# Patient Record
Sex: Male | Born: 1975 | Race: White | Hispanic: No | Marital: Married | Smoking: Never smoker
Health system: Southern US, Community
[De-identification: ages and names within clinical notes are randomized; demographics above are authoritative.]

## PROBLEM LIST (undated history)

## (undated) DIAGNOSIS — Z9889 Other specified postprocedural states: Secondary | ICD-10-CM

## (undated) DIAGNOSIS — R112 Nausea with vomiting, unspecified: Secondary | ICD-10-CM

## (undated) DIAGNOSIS — N200 Calculus of kidney: Secondary | ICD-10-CM

## (undated) DIAGNOSIS — J40 Bronchitis, not specified as acute or chronic: Secondary | ICD-10-CM

## (undated) DIAGNOSIS — I10 Essential (primary) hypertension: Secondary | ICD-10-CM

## (undated) DIAGNOSIS — G4730 Sleep apnea, unspecified: Secondary | ICD-10-CM

## (undated) HISTORY — PX: URETEROSCOPY: SHX842

---

## 1998-02-04 ENCOUNTER — Encounter: Payer: Self-pay | Admitting: Urology

## 1998-02-04 ENCOUNTER — Ambulatory Visit (HOSPITAL_COMMUNITY): Admission: RE | Admit: 1998-02-04 | Discharge: 1998-02-04 | Payer: Self-pay | Admitting: Urology

## 1998-12-21 ENCOUNTER — Encounter: Payer: Self-pay | Admitting: Urology

## 1998-12-21 ENCOUNTER — Ambulatory Visit (HOSPITAL_COMMUNITY): Admission: RE | Admit: 1998-12-21 | Discharge: 1998-12-21 | Payer: Self-pay | Admitting: Urology

## 2000-12-07 ENCOUNTER — Encounter: Payer: Self-pay | Admitting: Emergency Medicine

## 2000-12-07 ENCOUNTER — Emergency Department (HOSPITAL_COMMUNITY): Admission: EM | Admit: 2000-12-07 | Discharge: 2000-12-07 | Payer: Self-pay | Admitting: *Deleted

## 2000-12-20 ENCOUNTER — Encounter: Admission: RE | Admit: 2000-12-20 | Discharge: 2000-12-20 | Payer: Self-pay | Admitting: Urology

## 2000-12-20 ENCOUNTER — Encounter: Payer: Self-pay | Admitting: Urology

## 2001-04-04 ENCOUNTER — Encounter: Admission: RE | Admit: 2001-04-04 | Discharge: 2001-04-04 | Payer: Self-pay | Admitting: Urology

## 2001-04-04 ENCOUNTER — Encounter: Payer: Self-pay | Admitting: Urology

## 2001-05-09 ENCOUNTER — Encounter: Payer: Self-pay | Admitting: Urology

## 2001-05-09 ENCOUNTER — Encounter: Admission: RE | Admit: 2001-05-09 | Discharge: 2001-05-09 | Payer: Self-pay | Admitting: Urology

## 2001-09-07 ENCOUNTER — Encounter: Admission: RE | Admit: 2001-09-07 | Discharge: 2001-09-07 | Payer: Self-pay | Admitting: Urology

## 2001-09-07 ENCOUNTER — Encounter: Payer: Self-pay | Admitting: Urology

## 2002-05-02 ENCOUNTER — Ambulatory Visit (HOSPITAL_BASED_OUTPATIENT_CLINIC_OR_DEPARTMENT_OTHER): Admission: RE | Admit: 2002-05-02 | Discharge: 2002-05-02 | Payer: Self-pay | Admitting: Urology

## 2002-05-02 ENCOUNTER — Encounter: Payer: Self-pay | Admitting: Urology

## 2002-05-03 ENCOUNTER — Emergency Department (HOSPITAL_COMMUNITY): Admission: EM | Admit: 2002-05-03 | Discharge: 2002-05-03 | Payer: Self-pay | Admitting: Emergency Medicine

## 2002-05-03 ENCOUNTER — Encounter: Payer: Self-pay | Admitting: Emergency Medicine

## 2002-05-10 ENCOUNTER — Encounter: Payer: Self-pay | Admitting: Urology

## 2002-05-10 ENCOUNTER — Ambulatory Visit (HOSPITAL_COMMUNITY): Admission: RE | Admit: 2002-05-10 | Discharge: 2002-05-10 | Payer: Self-pay | Admitting: Urology

## 2003-12-11 ENCOUNTER — Ambulatory Visit (HOSPITAL_COMMUNITY): Admission: RE | Admit: 2003-12-11 | Discharge: 2003-12-11 | Payer: Self-pay | Admitting: Urology

## 2006-08-24 ENCOUNTER — Ambulatory Visit (HOSPITAL_COMMUNITY): Admission: RE | Admit: 2006-08-24 | Discharge: 2006-08-24 | Payer: Self-pay | Admitting: Urology

## 2006-10-02 ENCOUNTER — Ambulatory Visit (HOSPITAL_COMMUNITY): Admission: RE | Admit: 2006-10-02 | Discharge: 2006-10-02 | Payer: Self-pay | Admitting: Urology

## 2007-02-20 ENCOUNTER — Encounter: Admission: RE | Admit: 2007-02-20 | Discharge: 2007-02-20 | Payer: Self-pay | Admitting: Sports Medicine

## 2007-03-19 ENCOUNTER — Encounter: Admission: RE | Admit: 2007-03-19 | Discharge: 2007-03-19 | Payer: Self-pay | Admitting: Sports Medicine

## 2007-04-04 ENCOUNTER — Encounter: Admission: RE | Admit: 2007-04-04 | Discharge: 2007-04-04 | Payer: Self-pay | Admitting: Sports Medicine

## 2009-06-23 ENCOUNTER — Ambulatory Visit (HOSPITAL_BASED_OUTPATIENT_CLINIC_OR_DEPARTMENT_OTHER): Admission: RE | Admit: 2009-06-23 | Discharge: 2009-06-23 | Payer: Self-pay | Admitting: Urology

## 2010-04-18 ENCOUNTER — Encounter: Payer: Self-pay | Admitting: Sports Medicine

## 2010-06-20 LAB — POCT HEMOGLOBIN-HEMACUE: Hemoglobin: 16.2 g/dL (ref 13.0–17.0)

## 2011-09-27 ENCOUNTER — Other Ambulatory Visit: Payer: Self-pay | Admitting: Urology

## 2011-10-17 ENCOUNTER — Encounter (HOSPITAL_COMMUNITY): Admission: RE | Payer: Self-pay | Source: Ambulatory Visit

## 2011-10-17 ENCOUNTER — Ambulatory Visit (HOSPITAL_COMMUNITY): Admission: RE | Admit: 2011-10-17 | Payer: Self-pay | Source: Ambulatory Visit | Admitting: Urology

## 2011-10-17 SURGERY — LITHOTRIPSY, ESWL
Anesthesia: LOCAL | Laterality: Right

## 2012-01-06 ENCOUNTER — Emergency Department (HOSPITAL_COMMUNITY)
Admission: EM | Admit: 2012-01-06 | Discharge: 2012-01-06 | Disposition: A | Discharge status: Home / Self Care | Payer: BC Managed Care – PPO | Attending: Emergency Medicine | Admitting: Emergency Medicine

## 2012-01-06 ENCOUNTER — Encounter (HOSPITAL_COMMUNITY): Payer: Self-pay | Admitting: Emergency Medicine

## 2012-01-06 DIAGNOSIS — I10 Essential (primary) hypertension: Secondary | ICD-10-CM | POA: Insufficient documentation

## 2012-01-06 DIAGNOSIS — T63461A Toxic effect of venom of wasps, accidental (unintentional), initial encounter: Secondary | ICD-10-CM | POA: Insufficient documentation

## 2012-01-06 DIAGNOSIS — T7840XA Allergy, unspecified, initial encounter: Secondary | ICD-10-CM

## 2012-01-06 DIAGNOSIS — T6391XA Toxic effect of contact with unspecified venomous animal, accidental (unintentional), initial encounter: Secondary | ICD-10-CM | POA: Insufficient documentation

## 2012-01-06 HISTORY — DX: Calculus of kidney: N20.0

## 2012-01-06 HISTORY — DX: Essential (primary) hypertension: I10

## 2012-01-06 MED ORDER — METHYLPREDNISOLONE SODIUM SUCC 125 MG IJ SOLR
125.0000 mg | Freq: Once | INTRAMUSCULAR | Status: AC
  Administered 2012-01-06: 125 mg via INTRAVENOUS
  Filled 2012-01-06: qty 2

## 2012-01-06 MED ORDER — OXYCODONE-ACETAMINOPHEN 5-325 MG PO TABS
1.0000 | ORAL_TABLET | Freq: Once | ORAL | Status: AC
  Administered 2012-01-06: 1 via ORAL
  Filled 2012-01-06: qty 1

## 2012-01-06 MED ORDER — PREDNISONE 10 MG PO TABS: 60.0000 mg | ORAL_TABLET | Freq: Every day | ORAL | Status: DC

## 2012-01-06 MED ORDER — EPINEPHRINE HCL 1 MG/ML IJ SOLN
INTRAMUSCULAR | Status: AC
  Administered 2012-01-06: 13:00:00
  Filled 2012-01-06: qty 1

## 2012-01-06 MED ORDER — EPINEPHRINE 0.3 MG/0.3ML IJ DEVI: 0.3000 mg | INTRAMUSCULAR | Status: AC | PRN

## 2012-01-06 MED ORDER — DIPHENHYDRAMINE HCL 50 MG/ML IJ SOLN
50.0000 mg | Freq: Once | INTRAMUSCULAR | Status: AC
  Administered 2012-01-06: 50 mg via INTRAVENOUS
  Filled 2012-01-06: qty 1

## 2012-01-06 NOTE — ED Notes (Signed)
Per EMS, pt stung by yellow jacket in R hand. Pt admin Epi pen immediately. Pt stated EpiPen had already expired. EMS admininstered 0.3 Epi IM. Pt reports hx of worsening hives w/ Benedryl. EMS admin 50 mg Zantac. No SHOB, wheezes, resp. Difficulties per EMS. Mild hives on sides and feet bilaterally w/ decreased itching.

## 2012-01-06 NOTE — ED Notes (Signed)
WGN:[id number]<HY> Expected date:01/06/12<BR> Expected time:<BR> Means of arrival:<BR> Comments:<BR> [facility] bee sting to hand, used epi pen x 1, zantac-- allergic to benadryl

## 2012-01-06 NOTE — ED Provider Notes (Signed)
History     CSN: [id number]  Arrival date & time 01/06/12  1238   First MD Initiated Contact with Patient 01/06/12 1246      Chief Complaint  Patient presents with  . Allergic Reaction     The history is provided by the patient.   the patient reports yellow jacket bite to his right hand with immediate development of redness surrounding the bite as well as development of systemic urticaria.  He gave himself an epinephrine injection aklthough the epi pen was expired.  EMS called immediately.  He states he feels much better at this time.  His urticaria is almost resolved.  No difficulty breathing or swallowing at this time.  No facial swelling.  No tongue itching or swelling to  Past Medical History  Diagnosis Date  . Hypertension   . Kidney stones     History reviewed. No pertinent past surgical history.  No family history on file.  History  Substance Use Topics  . Smoking status: Not on file  . Smokeless tobacco: Not on file  . Alcohol Use:       Review of Systems  All other systems reviewed and are negative.    Allergies  Benadryl  Home Medications   Current Outpatient Rx  Name Route Sig Dispense Refill  . LISINOPRIL-HYDROCHLOROTHIAZIDE 10-12.5 MG PO TABS Oral Take 1 tablet by mouth daily.    Marland Kitchen TAMSULOSIN HCL 0.4 MG PO CAPS Oral Take 0.4 mg by mouth daily.      BP 149/100  Pulse 83  Temp 98 F (36.7 C) (Oral)  Resp 20  SpO2 98%  Physical Exam  Nursing note and vitals reviewed. Constitutional: He is oriented to person, place, and time. He appears well-developed and well-nourished.  HENT:  Head: Normocephalic and atraumatic.       Posterior pharynx normal.  Tolerating secretions.  Airway patent.  Eyes: EOM are normal.  Neck: Normal range of motion.  Cardiovascular: Normal rate, regular rhythm, normal heart sounds and intact distal pulses.   Pulmonary/Chest: Effort normal and breath sounds normal. No respiratory distress.  Abdominal: Soft. He  exhibits no distension. There is no tenderness.       Small hives remain on the abdominal wall  Musculoskeletal: Normal range of motion.  Neurological: He is alert and oriented to person, place, and time.  Skin: Skin is warm and dry.  Psychiatric: He has a normal mood and affect. Judgment normal.    ED Course  Procedures (including critical care time)  Labs Reviewed - No data to display No results found.   1. Allergic reaction       MDM  Likely severe urticaria.  No shortness of breath or difficulty breathing or sensation of throat closing. Epi given in route.  Symptoms improved at this time.  The patient be given Benadryl and Solu-Medrol.  There is this question of Benadryl allergy however I suspect he was having worsening hives the last time he took Benadryl and that Benadryl itself did not cause hives.  We'll monitor in the emergency Department total of 3 hours status post epinephrine.  This will be observation until 2:30 PM  2:55 PM Feels much better at this time. Tolerated benadryl         Lyanne Co, MD 01/06/12 1455

## 2012-09-27 ENCOUNTER — Ambulatory Visit (INDEPENDENT_AMBULATORY_CARE_PROVIDER_SITE_OTHER): Payer: BC Managed Care – PPO | Admitting: Sports Medicine

## 2012-09-27 ENCOUNTER — Encounter: Payer: Self-pay | Admitting: Sports Medicine

## 2012-09-27 VITALS — BP 146/94 | HR 87 | Ht 69.0 in | Wt 200.0 lb

## 2012-09-27 DIAGNOSIS — S161XXA Strain of muscle, fascia and tendon at neck level, initial encounter: Secondary | ICD-10-CM

## 2012-09-27 DIAGNOSIS — S139XXA Sprain of joints and ligaments of unspecified parts of neck, initial encounter: Secondary | ICD-10-CM

## 2012-09-27 MED ORDER — PREDNISONE 10 MG PO KIT: PACK | ORAL | Status: DC

## 2012-09-27 MED ORDER — HYDROCODONE-ACETAMINOPHEN 5-325 MG PO TABS: 1.0000 | ORAL_TABLET | Freq: Two times a day (BID) | ORAL | Status: DC | PRN

## 2012-09-27 NOTE — Progress Notes (Signed)
  Subjective:    Patient ID: Kevin Herman, male    DOB: 1975/04/30, 37 y.o.   MRN: [id number]  HPI chief complaint: Neck and bilateral shoulder pain  Patient is a very pleasant 37 year old male who comes in today complaining of one week of neck and bilateral shoulder pain. He has been a patient of mine at [facility] for years. 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 He will occasionally get a flareup which responds well to oral prednisone and hydrocodone. His last flareup was several months ago and he was actually doing very well until he fell onto his right elbow while trying to catch a ball last week. This "jammed" his neck and he's had returning symptoms since. Describes a diffuse discomfort across his neck and into the posterior shoulders. Similar in nature to the symptoms he's experienced previously. No radiating pain into his arms. No associated numbness or tingling. No deep-seated shoulder pain. Pain does awaken him at night.  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 No acute distress. Awake alert and oriented x3. Vital signs are reviewed  Cervical spine: There is limited cervical rotation both to the left and right by about 20 but it is nonpainful. There is full flexion and extension but reproducible pain with extension. No tenderness to palpation along cervical midline but there is diffuse tenderness along the paraspinal musculature as well as into the parascapular region. No spasm. Negative Spurling's bilaterally. Strength is 5/5 both upper  extremities. Biceps reflex on the right is 1/4 compared to 2/4 on the left. Brachial radialis and triceps reflexes are 2/4 and equal bilaterally. No atrophy. Sensation is intact to light touch grossly. Good radial and ulnar pulses.       Assessment & Plan:  1. Returning neck pain secondary to cervical strain  6 day Sterapred Dosepak to take as directed. Vicodin 5/325 to take twice a day when necessary #30 with no refills. If symptoms persist return to the office for reevaluation and further workup.

## 2013-01-26 DIAGNOSIS — J40 Bronchitis, not specified as acute or chronic: Secondary | ICD-10-CM

## 2013-01-26 HISTORY — DX: Bronchitis, not specified as acute or chronic: J40

## 2013-03-11 ENCOUNTER — Encounter: Payer: Self-pay | Admitting: Sports Medicine

## 2013-03-11 ENCOUNTER — Ambulatory Visit (INDEPENDENT_AMBULATORY_CARE_PROVIDER_SITE_OTHER): Payer: BC Managed Care – PPO | Admitting: Sports Medicine

## 2013-03-11 VITALS — BP 164/116 | HR 78 | Ht 69.0 in | Wt 200.0 lb

## 2013-03-11 DIAGNOSIS — Z5189 Encounter for other specified aftercare: Secondary | ICD-10-CM

## 2013-03-11 DIAGNOSIS — S335XXA Sprain of ligaments of lumbar spine, initial encounter: Secondary | ICD-10-CM

## 2013-03-11 DIAGNOSIS — S39012A Strain of muscle, fascia and tendon of lower back, initial encounter: Secondary | ICD-10-CM

## 2013-03-11 DIAGNOSIS — S161XXD Strain of muscle, fascia and tendon at neck level, subsequent encounter: Secondary | ICD-10-CM

## 2013-03-11 MED ORDER — HYDROCODONE-ACETAMINOPHEN 5-325 MG PO TABS: 1.0000 | ORAL_TABLET | Freq: Two times a day (BID) | ORAL | Status: DC | PRN

## 2013-03-11 MED ORDER — METHYLPREDNISOLONE ACETATE 80 MG/ML IJ SUSP
80.0000 mg | Freq: Once | INTRAMUSCULAR | Status: AC
  Administered 2013-03-11: 80 mg via INTRAMUSCULAR

## 2013-03-11 MED ORDER — KETOROLAC TROMETHAMINE 60 MG/2ML IM SOLN
60.0000 mg | Freq: Once | INTRAMUSCULAR | Status: AC
  Administered 2013-03-11: 60 mg via INTRAMUSCULAR

## 2013-03-12 NOTE — Progress Notes (Signed)
   Subjective:    Patient ID: Kevin Herman, male    DOB: 12-Apr-1975, 37 y.o.   MRN: [id number]  HPI chief complaint: Neck and low back pain  Taygen comes in today with returning neck pain. He has a well-documented history of recurring neck strain which has responded well in the past to oral prednisone and when necessary hydrocodone. Last seen in the office back in July. Symptoms resolved up until a couple weeks ago. He was wrestling around with a friend and feels like that is what has reaggravated his condition. No numbness and tingling into his arms. Symptoms are identical to what he's experienced previously. He is also complaining of acute mid to low back pain which occurred earlier today. 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 No radiating pain into his legs. No numbness or tingling in his legs. Interim medical history is unchanged    Review of Systems     Objective:   Physical Exam Well-developed, well-nourished. No acute distress. Vital signs are reviewed. Cervical spine: Limited cervical rotation to the right by about 50%. He has full cervical rotation to the left. Full flexion and extension. No tenderness along the midline but there is tenderness to palpation along the right paraspinal musculature and into the right trapezius. No palpable trigger point. No focal neurological deficits of either upper extremity. Lumbar spine: Mild tenderness to palpation along the paraspinal musculature. No midline tenderness. Negative straight leg raise bilaterally. No focal neurological deficit of either lower extremity .      Assessment & Plan:  1. Cervical strain 2. Acute mid to low back pain secondary to lumbar strain  80 mg of Depo-Medrol IM. 60 mg of Toradol IM. 6 day Sterapred Dosepak to take as directed starting tomorrow. Hydrocodone refill to take for more severe pain. Increase activity as tolerated. Followup for ongoing or  recalcitrant issues.  Of note, his blood pressure was noted to be markedly elevated at 164/116. He is instructed to followup with his primary care physician for further evaluation and treatment of this.

## 2013-08-06 ENCOUNTER — Other Ambulatory Visit: Payer: Self-pay | Admitting: Urology

## 2013-08-09 ENCOUNTER — Emergency Department (HOSPITAL_COMMUNITY): Payer: BC Managed Care – PPO

## 2013-08-09 ENCOUNTER — Encounter (HOSPITAL_COMMUNITY): Payer: Self-pay | Admitting: *Deleted

## 2013-08-09 ENCOUNTER — Encounter (HOSPITAL_COMMUNITY): Payer: Self-pay | Admitting: Emergency Medicine

## 2013-08-09 ENCOUNTER — Emergency Department (HOSPITAL_COMMUNITY)
Admission: EM | Admit: 2013-08-09 | Discharge: 2013-08-09 | Disposition: A | Discharge status: Home / Self Care | Payer: BC Managed Care – PPO | Attending: Emergency Medicine | Admitting: Emergency Medicine

## 2013-08-09 DIAGNOSIS — R110 Nausea: Secondary | ICD-10-CM | POA: Insufficient documentation

## 2013-08-09 DIAGNOSIS — Z87442 Personal history of urinary calculi: Secondary | ICD-10-CM | POA: Insufficient documentation

## 2013-08-09 DIAGNOSIS — Z8709 Personal history of other diseases of the respiratory system: Secondary | ICD-10-CM | POA: Insufficient documentation

## 2013-08-09 DIAGNOSIS — N201 Calculus of ureter: Secondary | ICD-10-CM | POA: Insufficient documentation

## 2013-08-09 DIAGNOSIS — N289 Disorder of kidney and ureter, unspecified: Secondary | ICD-10-CM

## 2013-08-09 DIAGNOSIS — I10 Essential (primary) hypertension: Secondary | ICD-10-CM | POA: Insufficient documentation

## 2013-08-09 LAB — I-STAT CHEM 8, ED
BUN: 29 mg/dL — ABNORMAL HIGH (ref 6–23)
CHLORIDE: 104 meq/L (ref 96–112)
Calcium, Ion: 1.2 mmol/L (ref 1.12–1.23)
Creatinine, Ser: 1.6 mg/dL — ABNORMAL HIGH (ref 0.50–1.35)
Glucose, Bld: 113 mg/dL — ABNORMAL HIGH (ref 70–99)
HEMATOCRIT: 53 % — AB (ref 39.0–52.0)
Hemoglobin: 18 g/dL — ABNORMAL HIGH (ref 13.0–17.0)
POTASSIUM: 4.4 meq/L (ref 3.7–5.3)
SODIUM: 140 meq/L (ref 137–147)
TCO2: 25 mmol/L (ref 0–100)

## 2013-08-09 LAB — URINALYSIS, ROUTINE W REFLEX MICROSCOPIC
Bilirubin Urine: NEGATIVE
Glucose, UA: NEGATIVE mg/dL
Ketones, ur: 15 mg/dL — AB
LEUKOCYTES UA: NEGATIVE
NITRITE: NEGATIVE
PH: 5.5 (ref 5.0–8.0)
Protein, ur: 30 mg/dL — AB
SPECIFIC GRAVITY, URINE: 1.026 (ref 1.005–1.030)
UROBILINOGEN UA: 0.2 mg/dL (ref 0.0–1.0)

## 2013-08-09 LAB — CBC WITH DIFFERENTIAL/PLATELET
Basophils Absolute: 0 10*3/uL (ref 0.0–0.1)
Basophils Relative: 0 % (ref 0–1)
EOS ABS: 0.1 10*3/uL (ref 0.0–0.7)
EOS PCT: 0 % (ref 0–5)
HCT: 48.4 % (ref 39.0–52.0)
HEMOGLOBIN: 16.7 g/dL (ref 13.0–17.0)
LYMPHS ABS: 1.5 10*3/uL (ref 0.7–4.0)
LYMPHS PCT: 13 % (ref 12–46)
MCH: 31.9 pg (ref 26.0–34.0)
MCHC: 34.5 g/dL (ref 30.0–36.0)
MCV: 92.5 fL (ref 78.0–100.0)
MONOS PCT: 7 % (ref 3–12)
Monocytes Absolute: 0.9 10*3/uL (ref 0.1–1.0)
Neutro Abs: 9.5 10*3/uL — ABNORMAL HIGH (ref 1.7–7.7)
Neutrophils Relative %: 80 % — ABNORMAL HIGH (ref 43–77)
PLATELETS: 211 10*3/uL (ref 150–400)
RBC: 5.23 MIL/uL (ref 4.22–5.81)
RDW: 12.4 % (ref 11.5–15.5)
WBC: 11.9 10*3/uL — AB (ref 4.0–10.5)

## 2013-08-09 LAB — URINE MICROSCOPIC-ADD ON

## 2013-08-09 MED ORDER — ONDANSETRON 8 MG PO TBDP: 8.0000 mg | ORAL_TABLET | Freq: Three times a day (TID) | ORAL | Status: DC | PRN

## 2013-08-09 MED ORDER — SODIUM CHLORIDE 0.9 % IV BOLUS (SEPSIS)
500.0000 mL | Freq: Once | INTRAVENOUS | Status: AC
  Administered 2013-08-09: 500 mL via INTRAVENOUS

## 2013-08-09 MED ORDER — HYDROMORPHONE HCL PF 1 MG/ML IJ SOLN
0.5000 mg | Freq: Once | INTRAMUSCULAR | Status: AC
  Administered 2013-08-09: 0.5 mg via INTRAVENOUS
  Filled 2013-08-09: qty 1

## 2013-08-09 MED ORDER — OXYCODONE-ACETAMINOPHEN 5-325 MG PO TABS: 1.0000 | ORAL_TABLET | Freq: Four times a day (QID) | ORAL | Status: DC | PRN

## 2013-08-09 MED ORDER — ONDANSETRON HCL 4 MG/2ML IJ SOLN
4.0000 mg | Freq: Once | INTRAMUSCULAR | Status: AC
  Administered 2013-08-09: 4 mg via INTRAVENOUS
  Filled 2013-08-09: qty 2

## 2013-08-09 NOTE — ED Provider Notes (Signed)
CSN: [id number]     Arrival date & time 08/09/13  1829 History   First MD Initiated Contact with Patient 08/09/13 1837     Chief Complaint  Patient presents with  . Flank Pain     (Consider location/radiation/quality/duration/timing/severity/associated sxs/prior Treatment) Patient is a 38 y.o. male presenting with flank pain. The history is provided by the patient.  Flank Pain This is a recurrent problem. Pertinent negatives include no chest pain, no abdominal pain, no headaches and no shortness of breath.   patient has 5 x 8 mm right ureteral stone. The pain began around a week ago he was seen in urology and had a KUB already. He is scheduled for lithotripsy in 3 days. No fevers. He states the pain got worse today. He's had nausea and has been eating less today. No fevers. States the pain is increased.  Past Medical History  Diagnosis Date  . Hypertension   . Kidney stones   . Bronchitis 01/2013   Past Surgical History  Procedure Laterality Date  . Ureteroscopy  1999,2000,2004,2005,07/2006,09/2006,2011  . Lithotripsy Right 08/12/2013   History reviewed. No pertinent family history. History  Substance Use Topics  . Smoking status: Never Smoker   . Smokeless tobacco: Never Used  . Alcohol Use: No    Review of Systems  Constitutional: Negative for fever, activity change and appetite change.  Eyes: Negative for pain.  Respiratory: Negative for chest tightness and shortness of breath.   Cardiovascular: Negative for chest pain and leg swelling.  Gastrointestinal: Positive for nausea. Negative for vomiting, abdominal pain and diarrhea.  Genitourinary: Positive for flank pain. Negative for penile swelling, scrotal swelling and testicular pain.  Musculoskeletal: Negative for back pain and neck stiffness.  Skin: Negative for rash.  Neurological: Negative for weakness, numbness and headaches.  Psychiatric/Behavioral: Negative for behavioral problems.      Allergies  Bee  venom  Home Medications   Prior to Admission medications   Medication Sig Start Date End Date Taking? Authorizing Provider  EPINEPHrine (EPIPEN) 0.3 mg/0.3 mL DEVI Inject 0.3 mLs (0.3 mg total) into the muscle as needed. 01/06/12  Yes Lyanne CoKevin M Campos, MD  HYDROcodone-acetaminophen (NORCO/VICODIN) 5-325 MG per tablet Take 1 tablet by mouth 2 (two) times daily as needed. 03/11/13  Yes Ralene Corkimothy R Draper, DO  silodosin (RAPAFLO) 8 MG CAPS capsule Take 8 mg by mouth at bedtime as needed (urine flow).    Yes Historical Provider, MD   BP 148/96  Pulse 63  Temp(Src) 97.6 F (36.4 C) (Oral)  Resp 18  Wt 204 lb (92.534 kg)  SpO2 98% Physical Exam  Nursing note and vitals reviewed. Constitutional: He is oriented to person, place, and time. He appears well-developed and well-nourished.  HENT:  Head: Normocephalic and atraumatic.  Eyes: EOM are normal. Pupils are equal, round, and reactive to light.  Neck: Normal range of motion. Neck supple.  Cardiovascular: Normal rate, regular rhythm and normal heart sounds.   No murmur heard. Pulmonary/Chest: Effort normal and breath sounds normal.  Abdominal: Soft. Bowel sounds are normal. He exhibits no distension and no mass. There is no tenderness. There is no rebound and no guarding.  Genitourinary: Penis normal.  CVA tenderness on right  Musculoskeletal: Normal range of motion. He exhibits no edema.  Neurological: He is alert and oriented to person, place, and time. No cranial nerve deficit.  Skin: Skin is warm and dry.  Psychiatric: He has a normal mood and affect.    ED Course  Procedures (  including critical care time) Labs Review Labs Reviewed  CBC WITH DIFFERENTIAL - Abnormal; Notable for the following:    WBC 11.9 (*)    Neutrophils Relative % 80 (*)    Neutro Abs 9.5 (*)    All other components within normal limits  URINALYSIS, ROUTINE W REFLEX MICROSCOPIC - Abnormal; Notable for the following:    APPearance CLOUDY (*)    Hgb urine  dipstick LARGE (*)    Ketones, ur 15 (*)    Protein, ur 30 (*)    All other components within normal limits  I-STAT CHEM 8, ED - Abnormal; Notable for the following:    BUN 29 (*)    Creatinine, Ser 1.60 (*)    Glucose, Bld 113 (*)    Hemoglobin 18.0 (*)    HCT 53.0 (*)    All other components within normal limits  URINE MICROSCOPIC-ADD ON    Imaging Review Dg Abd 1 View  08/09/2013   CLINICAL DATA:  Flank pain  EXAM: ABDOMEN - 1 VIEW  COMPARISON:  Aug 05, 2013  FINDINGS: There is a stable 5 mm calculus in the region of the lower pole the right kidney. There is a 5 mm calculus at the level of L3 on the right. There are two, 2 mm calculi in the lower pole left kidney. There are phleboliths in the pelvis. The bowel gas pattern is unremarkable. No obstruction or free air. There is moderate stool in the colon.  IMPRESSION: Renal and ureteral calculi as described. Bowel gas pattern unremarkable.   Electronically Signed   By: Bretta BangWilliam  Woodruff M.D.   On: 08/09/2013 19:18     EKG Interpretation None      MDM   Final diagnoses:  Right ureteral stone  Renal insufficiency    Patient with flank pain and no ureteral stone. X-Seith shows stable at level of L3. No UTI. Has had likely increasing creatinine. His been eating and drinking less. Patient was given IV fluid and has tolerated orals. He feels better after treatment. Discussed with Dr. Berneice HeinrichManny. Patient will followup as planned on Monday for lithotripsy he'll return for uncontrolled pain, decreased urination, or fevers.    Juliet RudeNathan R. Rubin PayorPickering, MD 08/09/13 2225

## 2013-08-09 NOTE — Discharge Instructions (Signed)
Kidney Stones  Kidney stones (urolithiasis) are deposits that form inside your kidneys. The intense pain is caused by the stone moving through the urinary tract. When the stone moves, the ureter goes into spasm around the stone. The stone is usually passed in the urine.   CAUSES   · A disorder that makes certain neck glands produce too much parathyroid hormone (primary hyperparathyroidism).  · A buildup of uric acid crystals, similar to gout in your joints.  · Narrowing (stricture) of the ureter.  · A kidney obstruction present at birth (congenital obstruction).  · Previous surgery on the kidney or ureters.  · Numerous kidney infections.  SYMPTOMS   · Feeling sick to your stomach (nauseous).  · Throwing up (vomiting).  · Blood in the urine (hematuria).  · Pain that usually spreads (radiates) to the groin.  · Frequency or urgency of urination.  DIAGNOSIS   · Taking a history and physical exam.  · Blood or urine tests.  · CT scan.  · Occasionally, an examination of the inside of the urinary bladder (cystoscopy) is performed.  TREATMENT   · Observation.  · Increasing your fluid intake.  · Extracorporeal shock wave lithotripsy This is a noninvasive procedure that uses shock waves to break up kidney stones.  · Surgery may be needed if you have severe pain or persistent obstruction. There are various surgical procedures. Most of the procedures are performed with the use of small instruments. Only small incisions are needed to accommodate these instruments, so recovery time is minimized.  The size, location, and chemical composition are all important variables that will determine the proper choice of action for you. Talk to your health care provider to better understand your situation so that you will minimize the risk of injury to yourself and your kidney.   HOME CARE INSTRUCTIONS   · Drink enough water and fluids to keep your urine clear or pale yellow. This will help you to pass the stone or stone fragments.  · Strain  all urine through the provided strainer. Keep all particulate matter and stones for your health care provider to see. The stone causing the pain may be as small as a grain of salt. It is very important to use the strainer each and every time you pass your urine. The collection of your stone will allow your health care provider to analyze it and verify that a stone has actually passed. The stone analysis will often identify what you can do to reduce the incidence of recurrences.  · Only take over-the-counter or prescription medicines for pain, discomfort, or fever as directed by your health care provider.  · Make a follow-up appointment with your health care provider as directed.  · Get follow-up X-rays if required. The absence of pain does not always mean that the stone has passed. It may have only stopped moving. If the urine remains completely obstructed, it can cause loss of kidney function or even complete destruction of the kidney. It is your responsibility to make sure X-rays and follow-ups are completed. Ultrasounds of the kidney can show blockages and the status of the kidney. Ultrasounds are not associated with any radiation and can be performed easily in a matter of minutes.  SEEK MEDICAL CARE IF:  · You experience pain that is progressive and unresponsive to any pain medicine you have been prescribed.  SEEK IMMEDIATE MEDICAL CARE IF:   · Pain cannot be controlled with the prescribed medicine.  · You have a fever   or shaking chills.  · The severity or intensity of pain increases over 18 hours and is not relieved by pain medicine.  · You develop a new onset of abdominal pain.  · You feel faint or pass out.  · You are unable to urinate.  MAKE SURE YOU:   · Understand these instructions.  · Will watch your condition.  · Will get help right away if you are not doing well or get worse.  Document Released: 03/14/2005 Document Revised: 11/14/2012 Document Reviewed: 08/15/2012  Exit[employer]® Patient Information ©2014  [employer].

## 2013-08-09 NOTE — ED Notes (Signed)
Pt aware urine sample is needed 

## 2013-08-09 NOTE — H&P (Signed)
Reason For Visit  For ESWL for right proximal ureteral calculus     History of Present Illness    Kevin Herman returns today as an urgent walk-in. He has now been experiencing some right-sided flank pain. Mild discomfort began 2 days ago, but became really intense this morning. The majority of his stone burden is known to be on the right side. KUB from the end of March of this year showed a 10 mm x 3 mm calcification as well as 2 additional 5 mm to 6 mm calcifications. His urinalysis today shows significant microhematuria and he is having fairly classic right-sided renal colic.   Past urologic history:   Kevin Herman has a long-standing history of nephrolithiasis has required multiple interventions. He developed right flank pain and [facility] early January of 2014.. The patient underwent ultrasonography rather than CT imaging. The patient was noted to have mild right hydronephrosis. The patient was felt to have multiple stones in the right kidney specific sizes and locations were not mentioned. The patient was felt to have a single nonobstructing stone in the lower pole was left kidney. Bilateral ureteral jets were noted.     Stone protocol CT scan was performed in our office in mid January of 2014. The right kidney shows several nonobstructing stones. The largest is about 4 x 9 mm in the lower pole. There is mild to moderate right hydronephrosis. The ureter remains dilated down to the distal aspect where there appears to be a 4 mm stone at the ureterovesical junction. The left kidney shows a single small lower pole calcification no bigger than one or 2 mm.   Interval history:  Kevin Herman subsequent pass the distal stone and is asymptomatic at this time. Again he does continue to have released moderate stone burden in the right side. He has no other new complaints or concerns today.   Past Medical History Problems  1. History of Murmur (785.2)  Surgical  History Problems  1. History of Cystoscopy With Ureteroscopy With Lithotripsy 2. History of Cystoscopy With Ureteroscopy With Manipulation Of Calculus 3. History of Lithotripsy 4. History of Lithotripsy  Current Meds 1. Hydrocodone-Acetaminophen 5-325 MG Oral Tablet; Take 1-2 tablets every 4-6 hours for  pain;  Therapy: 22Sep2010 to (Last Rx:08Apr2015) Ordered 2. Lisinopril 10 MG Oral Tablet;  Therapy: 20Jan2015 to Recorded 3. TiZANidine HCl - 4 MG Oral Tablet;  Therapy: 20Jan2015 to Recorded  Allergies Medication  1. Benadryl CAPS  Family History Problems  1. Family history of Alzheimer's Disease 2. Family history of Cardiac Failure 3. Family history of Family Health Status Number Of Children   1 daughter 4. Family history of Kidney Cancer (V16.51) 5. Family history of Skin Cancer : Father 6. Family history of Urologic Disorder (V18.7)   kidney stones  Social History Problems  1. Denied: Alcohol Use 2. Caffeine Use   2/3 3. Marital History - Currently Married 4. Never A Smoker 5. Occupation:   Runner, broadcasting/film/videoteacher 6. Denied: Tobacco Use (V15.82)  Review of Systems Genitourinary, constitutional, skin, eye, otolaryngeal, hematologic/lymphatic, cardiovascular, pulmonary, endocrine, musculoskeletal, gastrointestinal, neurological and psychiatric system(s) were reviewed and pertinent findings if present are noted.  Genitourinary: hematuria and inguinal pain, but urine stream is not weak and no suprapubic pain.  Gastrointestinal: nausea, flank pain and abdominal pain.  Constitutional: feeling tired (fatigue), but no fever.  Integumentary: pruritus.    Vitals Vital Signs [Data Includes: Last 1 Day]  Recorded: 11May2015 01:09PM  Blood Pressure: 165 /  110 Temperature: 97.2 F Heart Rate: 71  Physical Exam Constitutional:. In acute distress.  Pulmonary: No respiratory distress and normal respiratory rhythm and effort.  Cardiovascular: Heart rate and rhythm are normal . No  peripheral edema.  Abdomen: Soft nontender no CVA tenderness Extremities: No edema Neurologic: Nonfocal   Assessment Assessed  1. Nephrolithiasis (592.0) 2. Renal colic (788.0) 3. Calculus of ureter (592.1)  Plan  Calculus of ureter  1. Administered: Ketorolac Tromethamine 30 MG/ML Injection Solution 2. Follow-up Schedule Surgery Office  Follow-up  Status: Complete  Done: 11May2015 Health Maintenance  3. UA With REFLEX; [Do Not Release]; Status:Complete;   Done: 11May2015 12:48PM  Discussion/Summary   Kevin Herman underwent repeat imaging today with KUB. The 3 previously seen stones in the right kidney have changed and only 2 are now visible in the kidney. There is an approximately 5 mm x 7 mm calcification in the proximal right ureter. He has a reasonable track record of passing stones. This stone, however, remains in the proximal ureter and probably has about a 50/50 chance of passing. He is interested in getting on the lithotripter schedule for next week. If he passes the stone, we will be delighted to cancel. Otherwise, we will proceed with lithotripsy, assuming we can see the stone at the time of the procedure. He currently has adequate pain medication and will remain on an alpha-blocker.     Signatures Electronically signed by : Barron Alvineavid Airianna Kreischer, M.D.; Aug 09 2013 11:31AM EST

## 2013-08-09 NOTE — ED Notes (Signed)
Pt alert, arrives from home, c/o right flank pain ,onset was Monday, hx of kidney stones, has sx scheduled for this Monday, resp even unlabored, skin pwd

## 2013-08-11 ENCOUNTER — Emergency Department (HOSPITAL_COMMUNITY)
Admission: EM | Admit: 2013-08-11 | Discharge: 2013-08-11 | Disposition: A | Discharge status: Home / Self Care | Payer: BC Managed Care – PPO | Attending: Emergency Medicine | Admitting: Emergency Medicine

## 2013-08-11 ENCOUNTER — Encounter (HOSPITAL_COMMUNITY): Payer: Self-pay | Admitting: Emergency Medicine

## 2013-08-11 DIAGNOSIS — Z79899 Other long term (current) drug therapy: Secondary | ICD-10-CM | POA: Insufficient documentation

## 2013-08-11 DIAGNOSIS — N201 Calculus of ureter: Secondary | ICD-10-CM

## 2013-08-11 DIAGNOSIS — I10 Essential (primary) hypertension: Secondary | ICD-10-CM | POA: Insufficient documentation

## 2013-08-11 DIAGNOSIS — Z8709 Personal history of other diseases of the respiratory system: Secondary | ICD-10-CM | POA: Insufficient documentation

## 2013-08-11 LAB — URINALYSIS, ROUTINE W REFLEX MICROSCOPIC
Bilirubin Urine: NEGATIVE
GLUCOSE, UA: NEGATIVE mg/dL
Ketones, ur: NEGATIVE mg/dL
LEUKOCYTES UA: NEGATIVE
Nitrite: NEGATIVE
PH: 6 (ref 5.0–8.0)
Protein, ur: NEGATIVE mg/dL
Specific Gravity, Urine: 1.011 (ref 1.005–1.030)
Urobilinogen, UA: 0.2 mg/dL (ref 0.0–1.0)

## 2013-08-11 LAB — URINE MICROSCOPIC-ADD ON

## 2013-08-11 MED ORDER — HYDROMORPHONE HCL PF 2 MG/ML IJ SOLN
2.0000 mg | Freq: Once | INTRAMUSCULAR | Status: AC
  Administered 2013-08-11: 2 mg via INTRAVENOUS
  Filled 2013-08-11: qty 1

## 2013-08-11 MED ORDER — HYDROMORPHONE HCL PF 1 MG/ML IJ SOLN
1.0000 mg | Freq: Once | INTRAMUSCULAR | Status: AC
  Administered 2013-08-11: 1 mg via INTRAVENOUS
  Filled 2013-08-11: qty 1

## 2013-08-11 MED ORDER — SODIUM CHLORIDE 0.9 % IV BOLUS (SEPSIS)
1000.0000 mL | Freq: Once | INTRAVENOUS | Status: AC
  Administered 2013-08-11: 1000 mL via INTRAVENOUS

## 2013-08-11 MED ORDER — PROMETHAZINE HCL 25 MG RE SUPP: 25.0000 mg | Freq: Four times a day (QID) | RECTAL | Status: DC | PRN

## 2013-08-11 MED ORDER — OXYCODONE-ACETAMINOPHEN 5-325 MG PO TABS: 1.0000 | ORAL_TABLET | Freq: Four times a day (QID) | ORAL | Status: DC | PRN

## 2013-08-11 MED ORDER — ONDANSETRON HCL 4 MG/2ML IJ SOLN
4.0000 mg | Freq: Once | INTRAMUSCULAR | Status: AC
  Administered 2013-08-11: 4 mg via INTRAVENOUS
  Filled 2013-08-11: qty 2

## 2013-08-11 NOTE — ED Notes (Signed)
Pt has large hx of kidney stones.  Pt has rt flank pain starting 10 days ago.    Pt to have surgery (lithotripsy) tomorrow.  Pain has gotten worse.

## 2013-08-11 NOTE — Discharge Instructions (Signed)
Ureteral Colic (Kidney Stones) Ureteral colic is the result of a condition when kidney stones form inside the kidney. Once kidney stones are formed they may move into the tube that connects the kidney with the bladder (ureter). If this occurs, this condition may cause pain (colic) in the ureter.  CAUSES  Pain is caused by stone movement in the ureter and the obstruction caused by the stone. SYMPTOMS  The pain comes and goes as the ureter contracts around the stone. The pain is usually intense, sharp, and stabbing in character. The location of the pain may move as the stone moves through the ureter. When the stone is near the kidney the pain is usually located in the back and radiates to the belly (abdomen). When the stone is ready to pass into the bladder the pain is often located in the lower abdomen on the side the stone is located. At this location, the symptoms may mimic those of a urinary tract infection with urinary frequency. Once the stone is located here it often passes into the bladder and the pain disappears completely. TREATMENT   Your caregiver will provide you with medicine for pain relief.  You may require specialized follow-up X-rays.  The absence of pain does not always mean that the stone has passed. It may have just stopped moving. If the urine remains completely obstructed, it can cause loss of kidney function or even complete destruction of the involved kidney. It is your responsibility and in your interest that X-rays and follow-ups as suggested by your caregiver are completed. Relief of pain without passage of the stone can be associated with severe damage to the kidney, including loss of kidney function on that side.  If your stone does not pass on its own, additional measures may be taken by your caregiver to ensure its removal. HOME CARE INSTRUCTIONS   Increase your fluid intake. Water is the preferred fluid since juices containing vitamin C may acidify the urine making it  less likely for certain stones (uric acid stones) to pass.  Strain all urine. A strainer will be provided. Keep all particulate matter or stones for your caregiver to inspect.  Take your pain medicine as directed.  Make a follow-up appointment with your caregiver as directed.  Remember that the goal is passage of your stone. The absence of pain does not mean the stone is gone. Follow your caregiver's instructions.  Only take over-the-counter or prescription medicines for pain, discomfort, or fever as directed by your caregiver. SEEK MEDICAL CARE IF:   Pain cannot be controlled with the prescribed medicine.  You have a fever.  Pain continues for longer than your caregiver advises it should.  There is a change in the pain, and you develop chest discomfort or constant abdominal pain.  You feel faint or pass out. MAKE SURE YOU:   Understand these instructions.  Will watch your condition.  Will get help right away if you are not doing well or get worse. Document Released: 12/22/2004 Document Revised: 07/09/2012 Document Reviewed: 09/08/2010 [facility] Patient Information 2014 [place], [place].  Lithotripsy, Care After Refer to this sheet in the next few weeks. These instructions provide you with information on caring for yourself after your procedure. Your health care provider may also give you more specific instructions. Your treatment has been planned according to current medical practices, but problems sometimes occur. Call your health care provider if you have any problems or questions after your procedure. WHAT TO EXPECT AFTER THE PROCEDURE  Your urine may have a red tinge for a few days after treatment. Blood loss is usually minimal.  You may have soreness in the back or flank area. This usually goes away after a few days. The procedure can cause blotches or bruises on the back where the pressure wave enters the skin. These marks usually cause only minimal discomfort and should  disappear in a short time.  Stone fragments should begin to pass within 24 hours of treatment. However, a delayed passage is not unusual.  You may have pain, discomfort, and feel sick to your stomach (nauseated) when the crushed fragments of stone are passed down the tube from the kidney to the bladder. Stone fragments can pass soon after the procedure and may last for up to 4 8 weeks.  A small number of patients may have severe pain when stone fragments are not able to pass, which leads to an obstruction.  If your stone is greater than 1 inch (2.5 cm) in diameter or if you have multiple stones that have a combined diameter greater than 1 inch (2.5 cm), you may require more than one treatment.  If you had a stent placed prior to your procedure, you may experience some discomfort, especially during urination. You may experience the pain or discomfort in your flank or back, or you may experience a sharp pain or discomfort at the base of your penis or in your lower abdomen. The discomfort usually lasts only a few minutes after urinating. HOME CARE INSTRUCTIONS   Rest at home until you feel your energy improving.  Only take over-the-counter or prescription medicines for pain, discomfort, or fever as directed by your health care provider. Depending on the type of lithotripsy, you may need to take antibiotics and anti-inflammatory medicines for a few days.  Drink enough water and fluids to keep your urine clear or pale yellow. This helps "flush" your kidneys. It helps pass any remaining pieces of stone and prevents stones from coming back.  Most people can resume daily activities within 1 2 days after standard lithotripsy. It can take longer to recover from laser and percutaneous lithotripsy.  If the stones are in your urinary system, you may be asked to strain your urine at home to look for stones. Any stones that are found can be sent to a medical lab for examination.  Visit your health care  provider for a follow-up appointment in a few weeks. Your doctor may remove your stent if you have one. Your health care provider will also check to see whether stone particles still remain. SEEK MEDICAL CARE IF:   Your pain is not relieved by medicine.  You have a lasting nauseous feeling.  You feel there is too much blood in the urine.  You develop persistent problems with frequent or painful urination that does not at least partially improve after 2 days following the procedure.  You have a congested cough.  You feel lightheaded.  You develop a rash or any other signs that might suggest an allergic problem.  You develop any reaction or side effects to your medicine(s). SEEK IMMEDIATE MEDICAL CARE IF:   You experience severe back or flank pain or both.  You see nothing but blood when you urinate.  You cannot pass any urine at all.  You have a fever or shaking chills.  You develop shortness of breath, difficulty breathing, or chest pain.  You develop vomiting that will not stop after 6 8 hours.  You have a  fainting episode. Document Released: 04/03/2007 Document Revised: 01/02/2013 Document Reviewed: 09/27/2012 [facility] Patient Information 2014 [place].

## 2013-08-11 NOTE — ED Provider Notes (Signed)
CSN: [id number]     Arrival date & time 08/11/13  1211 History   First MD Initiated Contact with Patient 08/11/13 1237     Chief Complaint  Patient presents with  . Flank Pain     (Consider location/radiation/quality/duration/timing/severity/associated sxs/prior Treatment) HPI Comments: Patient is a 38 year old male with history of hypertension, kidney stones, bronchitis he presents today with known kidney stone. He states that the stone measures 5 mm x 8 mm. He sees Dr. Berneice HeinrichManny who plans to do lithotripsy tomorrow. He was unable to fill the Percocet prescribed to him by Dr. Rubin PayorPickering at his last ED visit. He has been taking hydrocodone without relief. His pain is a sharp pain that begins in his right back and radiates into his right lower quadrant. He has had associated nausea and vomiting due to the pain. No diarrhea. He has history of multiple stones requiring lithotripsy in the past. No fever, chills.  Patient is a 38 y.o. male presenting with flank pain. The history is provided by the patient. No language interpreter was used.  Flank Pain Associated symptoms include nausea and vomiting. Pertinent negatives include no abdominal pain, chest pain or fever.    Past Medical History  Diagnosis Date  . Hypertension   . Kidney stones   . Bronchitis 01/2013   Past Surgical History  Procedure Laterality Date  . Ureteroscopy  1999,2000,2004,2005,07/2006,09/2006,2011  . Lithotripsy Right 08/12/2013   History reviewed. No pertinent family history. History  Substance Use Topics  . Smoking status: Never Smoker   . Smokeless tobacco: Never Used  . Alcohol Use: No    Review of Systems  Constitutional: Negative for fever.  Respiratory: Negative for shortness of breath.   Cardiovascular: Negative for chest pain.  Gastrointestinal: Positive for nausea and vomiting. Negative for abdominal pain.  Genitourinary: Positive for dysuria and flank pain.  All other systems reviewed and are  negative.     Allergies  Bee venom  Home Medications   Prior to Admission medications   Medication Sig Start Date End Date Taking? Authorizing Provider  HYDROcodone-acetaminophen (NORCO/VICODIN) 5-325 MG per tablet Take 1 tablet by mouth 2 (two) times daily as needed. 03/11/13  Yes Ozzie Hoyleimothy R Draper, DO  ondansetron (ZOFRAN-ODT) 8 MG disintegrating tablet Take 1 tablet (8 mg total) by mouth every 8 (eight) hours as needed for nausea or vomiting. 08/09/13  Yes Juliet RudeNathan R. Pickering, MD  silodosin (RAPAFLO) 8 MG CAPS capsule Take 8 mg by mouth daily.    Yes Historical Provider, MD  EPINEPHrine (EPIPEN) 0.3 mg/0.3 mL DEVI Inject 0.3 mLs (0.3 mg total) into the muscle as needed. 01/06/12   Lyanne CoKevin M Campos, MD  oxyCODONE-acetaminophen (PERCOCET/ROXICET) 5-325 MG per tablet Take 1-2 tablets by mouth every 6 (six) hours as needed for severe pain. 08/09/13   Juliet RudeNathan R. Pickering, MD   BP 159/101  Pulse 60  Temp(Src) 97.5 F (36.4 C) (Oral)  Resp 20  SpO2 100% Physical Exam  Nursing note and vitals reviewed. Constitutional: He is oriented to person, place, and time. He appears well-developed and well-nourished. He appears distressed.  HENT:  Head: Normocephalic and atraumatic.  Right Ear: External ear normal.  Left Ear: External ear normal.  Nose: Nose normal.  Eyes: Conjunctivae are normal.  Neck: Normal range of motion. No tracheal deviation present.  Cardiovascular: Normal rate, regular rhythm and normal heart sounds.   Pulmonary/Chest: Effort normal and breath sounds normal. No stridor.  Abdominal: Soft. He exhibits no distension. There is no tenderness.  Musculoskeletal: Normal range of motion.  ttp over right flank  Neurological: He is alert and oriented to person, place, and time.  Skin: Skin is warm and dry. He is not diaphoretic.  Psychiatric: He has a normal mood and affect. His behavior is normal.    ED Course  Procedures (including critical care time) Labs Review Labs  Reviewed  URINALYSIS, ROUTINE W REFLEX MICROSCOPIC - Abnormal; Notable for the following:    Hgb urine dipstick SMALL (*)    All other components within normal limits  URINE CULTURE  URINE MICROSCOPIC-ADD ON    Imaging Review Dg Abd 1 View  08/09/2013   CLINICAL DATA:  Flank pain  EXAM: ABDOMEN - 1 VIEW  COMPARISON:  Aug 05, 2013  FINDINGS: There is a stable 5 mm calculus in the region of the lower pole the right kidney. There is a 5 mm calculus at the level of L3 on the right. There are two, 2 mm calculi in the lower pole left kidney. There are phleboliths in the pelvis. The bowel gas pattern is unremarkable. No obstruction or free air. There is moderate stool in the colon.  IMPRESSION: Renal and ureteral calculi as described. Bowel gas pattern unremarkable.   Electronically Signed   By: Bretta BangWilliam  Woodruff M.D.   On: 08/09/2013 19:18     EKG Interpretation None      MDM   Final diagnoses:  Right ureteral stone    Patient presents to ED for evaluation of right ureteral stone measuring 5mm x 8mm. He has scheduled lithotripsy tomorrow. He was unable to fill percocet and hydrocodone was not  Improving his pain. Pain control was achieved in ED with 3mg  of dilaudid. Patient was discharged home with percocet and phenergan suppositories. Patient will go to pharmacy to pick meds up after discharge. UA shows no infection. He will follow up with Dr. Berneice HeinrichManny tomorrow. Discussed reasons to return to ED sooner. Vital signs stable for discharge. Discussed case with Dr. Elesa MassedWard who agrees with plan. Patient / Family / Caregiver informed of clinical course, understand medical decision-making process, and agree with plan.   Medications  HYDROmorphone (DILAUDID) injection 2 mg (2 mg Intravenous Given 08/11/13 1346)  ondansetron (ZOFRAN) injection 4 mg (4 mg Intravenous Given 08/11/13 1347)  sodium chloride 0.9 % bolus 1,000 mL (0 mLs Intravenous Stopped 08/11/13 1553)  HYDROmorphone (DILAUDID) injection 1 mg (1  mg Intravenous Given 08/11/13 1601)       Mora BellmanHannah S Ashwin Tibbs, PA-C 08/12/13 1206

## 2013-08-12 ENCOUNTER — Ambulatory Visit (HOSPITAL_COMMUNITY)
Admission: RE | Admit: 2013-08-12 | Discharge: 2013-08-12 | Disposition: A | Discharge status: Home / Self Care | Payer: BC Managed Care – PPO | Source: Ambulatory Visit | Attending: Urology | Admitting: Urology

## 2013-08-12 ENCOUNTER — Ambulatory Visit (HOSPITAL_COMMUNITY): Payer: BC Managed Care – PPO

## 2013-08-12 ENCOUNTER — Encounter (HOSPITAL_COMMUNITY): Payer: Self-pay | Admitting: *Deleted

## 2013-08-12 ENCOUNTER — Encounter (HOSPITAL_COMMUNITY)
Admission: RE | Disposition: A | Discharge status: Home / Self Care | Payer: Self-pay | Source: Ambulatory Visit | Attending: Urology

## 2013-08-12 DIAGNOSIS — N1330 Unspecified hydronephrosis: Secondary | ICD-10-CM | POA: Insufficient documentation

## 2013-08-12 DIAGNOSIS — N201 Calculus of ureter: Secondary | ICD-10-CM | POA: Insufficient documentation

## 2013-08-12 DIAGNOSIS — Z79899 Other long term (current) drug therapy: Secondary | ICD-10-CM | POA: Insufficient documentation

## 2013-08-12 DIAGNOSIS — Z888 Allergy status to other drugs, medicaments and biological substances status: Secondary | ICD-10-CM | POA: Insufficient documentation

## 2013-08-12 HISTORY — PX: LITHOTRIPSY: SUR834

## 2013-08-12 HISTORY — DX: Bronchitis, not specified as acute or chronic: J40

## 2013-08-12 LAB — URINE CULTURE
Colony Count: NO GROWTH
Culture: NO GROWTH

## 2013-08-12 SURGERY — LITHOTRIPSY, ESWL
Anesthesia: LOCAL | Laterality: Right

## 2013-08-12 MED ORDER — DIAZEPAM 5 MG PO TABS
10.0000 mg | ORAL_TABLET | ORAL | Status: AC
  Administered 2013-08-12: 10 mg via ORAL
  Filled 2013-08-12: qty 2

## 2013-08-12 MED ORDER — DEXTROSE-NACL 5-0.45 % IV SOLN
INTRAVENOUS | Status: DC
  Administered 2013-08-12: 09:00:00 via INTRAVENOUS

## 2013-08-12 MED ORDER — CIPROFLOXACIN HCL 500 MG PO TABS
500.0000 mg | ORAL_TABLET | ORAL | Status: AC
Start: 2013-08-12 — End: 2013-08-12
  Administered 2013-08-12: 500 mg via ORAL
  Filled 2013-08-12: qty 1

## 2013-08-12 MED ORDER — DIPHENHYDRAMINE HCL 25 MG PO CAPS
25.0000 mg | ORAL_CAPSULE | ORAL | Status: AC
  Administered 2013-08-12: 25 mg via ORAL
  Filled 2013-08-12: qty 1

## 2013-08-12 NOTE — Op Note (Signed)
See [facility] OP note scanned into chart. 

## 2013-08-12 NOTE — Discharge Instructions (Signed)
See [facility] discharge instructions in chart.  

## 2013-08-12 NOTE — Interval H&P Note (Signed)
History and Physical Interval Note:  08/12/2013 10:35 AM  Kevin Herman  has presented today for surgery, with the diagnosis of Right Ureteral Calculus  The various methods of treatment have been discussed with the patient and family. After consideration of risks, benefits and other options for treatment, the patient has consented to  Procedure(s): RIGHT EXTRACORPOREAL SHOCK WAVE LITHOTRIPSY (ESWL) (Right) as a surgical intervention .  The patient's history has been reviewed, patient examined, no change in status, stable for surgery.  I have reviewed the patient's chart and labs.  Questions were answered to the patient's satisfaction.     Valetta Fulleravid S Nabilah Davoli

## 2013-08-14 NOTE — ED Provider Notes (Signed)
Medical screening examination/treatment/procedure(s) were performed by non-physician practitioner and as supervising physician I was immediately available for consultation/collaboration.   EKG Interpretation None        Kristen N Ward, DO 08/14/13 1458 

## 2013-10-16 ENCOUNTER — Encounter: Payer: Self-pay | Admitting: Sports Medicine

## 2013-10-16 ENCOUNTER — Ambulatory Visit (INDEPENDENT_AMBULATORY_CARE_PROVIDER_SITE_OTHER): Payer: BC Managed Care – PPO | Admitting: Sports Medicine

## 2013-10-16 VITALS — BP 152/109 | Ht 69.0 in | Wt 205.0 lb

## 2013-10-16 DIAGNOSIS — M25561 Pain in right knee: Secondary | ICD-10-CM

## 2013-10-16 DIAGNOSIS — M25569 Pain in unspecified knee: Secondary | ICD-10-CM

## 2013-10-16 MED ORDER — MELOXICAM 15 MG PO TABS: 15.0000 mg | ORAL_TABLET | Freq: Every day | ORAL | Status: DC

## 2013-10-16 NOTE — Assessment & Plan Note (Addendum)
Suspect grade 1 MCL sprain.  Possible underlying medial meniscus injury - Patient instructed on quadriceps exercises and instructed to continue wearing knee brace  - Mobic x7 days - Advised patient to call in 1-2 weeks if pain not improved/resolved; will consider MRI at that time to evaluate possible meniscal injury

## 2013-10-16 NOTE — Progress Notes (Signed)
  Kevin AguasJeremy T Herman - 38 y.o. male MRN [id number]  Date of birth: [birth year]    SUBJECTIVE:     Comes in today for evaluation of right knee pain.  [place] twisted his knee reaching for his son.  He had pain on the medial aspect of his knee and some swelling which was improved somewhat with ice and NSAIDs.  Your brace for a few days, but did not notice any improvement.  He notes the pain is worse with standing after long periods of inactivity , and with twisting motions.  Denies any pain with walking.  Denies any locking or giving out.  History of right patellar dislocation.  Denies any previous right hip, knee, ankle, trauma or surgeries.    ROS:     See HPI  PERTINENT  PMH / PSH FH / / SH:  Past Medical, Surgical, Social, and Family History Reviewed & Updated in the EMR.  Pertinent findings include:   Right patellar dislocation; Not requiring surgery  OBJECTIVE: BP 152/109  Ht 5\' 9"  (1.753 m)  Wt 205 lb (92.987 kg)  BMI 30.26 kg/m2  Physical Exam:  Vital signs are reviewed.  Right Knee: Minimal swelling; no erythema.  Point tenderness over superior MCL and medial joint line as well as pain over the vastus medialis  ROM full in flexion and extension and lower leg rotation. Ligaments with solid consistent endpoints including ACL, PCL, LCL, MCL;  Negative anterior & posterior drawer test, varus and valgus stress testing, and Lachman Positive Mcmurray's and Thessaly Non painful patellar compression. Patellar glide without crepitus. Patellar and quadriceps tendons unremarkable. Hamstring and quadriceps strength is normal.   ASSESSMENT & PLAN:  See problem based charting & AVS for pt instructions.

## 2013-10-16 NOTE — Patient Instructions (Signed)
It was great seeing you today.   1. Quad exercises 2. Take mobic 15mg  daily for the next 7 days 3. Wear knee brace when active 4. Call/text in 2 weeks if pain not improved and will consider imaging  Take Care,   Dr Margaretha Sheffieldraper

## 2014-01-21 ENCOUNTER — Other Ambulatory Visit: Payer: Self-pay | Admitting: Urology

## 2014-01-28 ENCOUNTER — Encounter (HOSPITAL_COMMUNITY): Payer: Self-pay | Admitting: *Deleted

## 2014-02-02 NOTE — H&P (Signed)
History of Present Illness   Kevin Herman presents today as a walk-in appointment with possible recurrent clinical stone event. He was here just about a month ago and on KUB imaging had 2 stones in his right kidney. One was approximately 10 cm x 4 cm in the mid pole and the other was 6 cm x 6 cm in the lower pole. Late last week he began developing some intermittent right-sided renal colic. It has been intermittent. He had some hematuria several days ago.      Past Medical History Problems  1. History of Murmur (R01.1)  Surgical History Problems  1. History of Cystoscopy With Ureteroscopy With Lithotripsy 2. History of Cystoscopy With Ureteroscopy With Manipulation Of Calculus 3. History of Lithotripsy 4. History of Lithotripsy 5. History of Lithotripsy  Current Meds 1. Hydrocodone-Acetaminophen 5-325 MG Oral Tablet; Take 1-2 tablets every 4-6 hours for  pain;  Therapy: 22Sep2010 to (Last Rx:03Sep2015) Ordered 2. Lisinopril 10 MG Oral Tablet;  Therapy: 20Jan2015 to Recorded 3. Promethazine HCl - 25 MG Oral Tablet; TAKE 1 TABLET Every  6 hours PRN nausea;  Therapy: 28Jul2009 to (Last Rx:15May2015)  Requested for: 15May2015 Ordered 4. TiZANidine HCl - 4 MG Oral Tablet;  Therapy: 20Jan2015 to Recorded  Allergies Medication  1. Benadryl CAPS  Family History Problems  1. Family history of Alzheimer's Disease 2. Family history of Cardiac Failure 3. Family history of Family Health Status Number Of Children   1 daughter 4. Family history of Kidney Cancer 5. Family history of Skin Cancer : Father 6. Family history of Urologic Disorder   kidney stones  Social History Problems  1. Denied: Alcohol Use 2. Caffeine Use   2/3 3. Marital History - Currently Married 4. Never A Smoker 5. Occupation:   Runner, broadcasting/film/videoteacher 6. Denied: Tobacco Use  Review of Systems Genitourinary, constitutional, skin, eye, otolaryngeal, hematologic/lymphatic, cardiovascular, pulmonary, endocrine,  musculoskeletal, gastrointestinal, neurological and psychiatric system(s) were reviewed and pertinent findings if present are noted.  Genitourinary: hematuria and inguinal pain, but urine stream is not weak and no suprapubic pain.  Gastrointestinal: flank pain and abdominal pain, but no nausea.  Constitutional: feeling tired (fatigue), but no fever.  Integumentary: pruritus.    Vitals Vital Signs [Data Includes: Last 1 Day]  Recorded: 26Oct2015 01:43PM  Blood Pressure: 172 / 106 Temperature: 97 F Heart Rate: 73  Physical Exam ENT:. The ears and nose are normal in appearance.  Neck: The appearance of the neck is normal and no neck mass is present.  Pulmonary: No respiratory distress and normal respiratory rhythm and effort.  Cardiovascular: Heart rate and rhythm are normal . No peripheral edema.  Abdomen: The abdomen is soft and nontender. No masses are palpated. No CVA tenderness. No hernias are palpable. No hepatosplenomegaly noted.  Neuro/Psych:. Mood and affect are appropriate.    Results/Data Urine [Data Includes: Last 1 Day]   26Oct2015  COLOR YELLOW   APPEARANCE CLEAR   SPECIFIC GRAVITY 1.020   pH 6.0   GLUCOSE NEG mg/dL  BILIRUBIN NEG   KETONE NEG mg/dL  BLOOD MOD   PROTEIN NEG mg/dL  UROBILINOGEN 0.2 mg/dL  NITRITE NEG   LEUKOCYTE ESTERASE NEG   SQUAMOUS EPITHELIAL/HPF NONE SEEN   WBC NONE SEEN WBC/hpf  RBC 3-6 RBC/hpf  BACTERIA NONE SEEN   CRYSTALS NONE SEEN   CASTS NONE SEEN    Assessment Assessed  1. Nephrolithiasis (N20.0)  Plan Health Maintenance  1. UA With REFLEX; [Do Not Release]; Status:Resulted - Requires Verification;   Done:  26Oct2015 01:35PM Nephrolithiasis  2. KUB; Status:Resulted - Requires Verification;   Done: 26Oct2015 12:00AM Renal colic  3. Renew: Hydrocodone-Acetaminophen 5-325 MG Oral Tablet; Take 1-2 tablets every 4-6  hours for pain  Discussion/Summary   Kevin Herman has had some recurrent right-sided flank discomfort that he  feels is consistent with previous episodes of renal colic. Urine today shows a small degree of microhematuria. On KUB imaging, the 2 significant stones in the right kidney appear to be unchanged in size and location. He has some calcifications in his right hemipelvis that are unchanged from his last KUB and appear to be phleboliths to my mind. I do not see a definite ureteral stone. He may have a small stone. If he does, hopefully there is an excellent chance of passing. Kevin Herman is interested in going ahead with elective treatment on his right renal calculi. I told him we could try to treat both stones, but we will want to concentrate probably on the mid pole stone and be sure that was adequately broken up before going to the lower pole stone. We also have to be sure that any pain he is having currently resolves before doing lithotripsy in a few weeks. If he continues to have pain on the right side, then we will probably have to get a CT scan to be sure he does not have a smaller distal ureteral stone causing any obstruction, before we proceed with lithotripsy.      Amendment  Patient requested a Toradol injection for ongoing pain which was administered to him. 60 mg IM given1     1 Amended By: Barron Alvineavid Candance Bohlman; Jan 24 2014 8:15 AM EST  Signatures Electronically signed by : Barron Alvineavid Chaniya Genter, M.D.; Jan 24 2014  8:15AM EST

## 2014-02-03 ENCOUNTER — Ambulatory Visit (HOSPITAL_COMMUNITY)
Admission: RE | Admit: 2014-02-03 | Discharge: 2014-02-03 | Disposition: A | Discharge status: Home / Self Care | Payer: BC Managed Care – PPO | Source: Ambulatory Visit | Attending: Urology | Admitting: Urology

## 2014-02-03 ENCOUNTER — Encounter (HOSPITAL_COMMUNITY): Payer: Self-pay | Admitting: *Deleted

## 2014-02-03 ENCOUNTER — Encounter (HOSPITAL_COMMUNITY)
Admission: RE | Disposition: A | Discharge status: Home / Self Care | Payer: Self-pay | Source: Ambulatory Visit | Attending: Urology

## 2014-02-03 ENCOUNTER — Ambulatory Visit (HOSPITAL_COMMUNITY): Payer: BC Managed Care – PPO

## 2014-02-03 DIAGNOSIS — R011 Cardiac murmur, unspecified: Secondary | ICD-10-CM | POA: Insufficient documentation

## 2014-02-03 DIAGNOSIS — N200 Calculus of kidney: Secondary | ICD-10-CM | POA: Diagnosis present

## 2014-02-03 SURGERY — LITHOTRIPSY, ESWL
Anesthesia: LOCAL | Laterality: Right

## 2014-02-03 MED ORDER — DIPHENHYDRAMINE HCL 25 MG PO CAPS
25.0000 mg | ORAL_CAPSULE | ORAL | Status: AC
  Administered 2014-02-03: 25 mg via ORAL
  Filled 2014-02-03: qty 1

## 2014-02-03 MED ORDER — CIPROFLOXACIN HCL 500 MG PO TABS
500.0000 mg | ORAL_TABLET | ORAL | Status: AC
  Administered 2014-02-03: 500 mg via ORAL
  Filled 2014-02-03: qty 1

## 2014-02-03 MED ORDER — DIAZEPAM 5 MG PO TABS
10.0000 mg | ORAL_TABLET | ORAL | Status: AC
  Administered 2014-02-03: 10 mg via ORAL
  Filled 2014-02-03: qty 2

## 2014-02-03 MED ORDER — DEXTROSE-NACL 5-0.45 % IV SOLN
INTRAVENOUS | Status: DC
  Administered 2014-02-03: 08:00:00 via INTRAVENOUS

## 2014-02-03 MED ORDER — HYDROCODONE-ACETAMINOPHEN 5-325 MG PO TABS
1.0000 | ORAL_TABLET | Freq: Once | ORAL | Status: AC
  Administered 2014-02-03: 1 via ORAL
  Filled 2014-02-03: qty 1

## 2014-02-03 NOTE — Interval H&P Note (Signed)
History and Physical Interval Note:  02/03/2014 9:25 AM  Kevin AguasJeremy T Smallman  has presented today for surgery, with the diagnosis of Left Renal Calculi  The various methods of treatment have been discussed with the patient and family. After consideration of risks, benefits and other options for treatment, the patient has consented to  Procedure(s): LEFT EXTRACORPOREAL SHOCK WAVE LITHOTRIPSY (ESWL) (Right) as a surgical intervention .  The patient's history has been reviewed, patient examined, no change in status, stable for surgery.  I have reviewed the patient's chart and labs.  Questions were answered to the patient's satisfaction.     [name]

## 2014-02-03 NOTE — Progress Notes (Signed)
Patient stated pain 4/10 and requested pain med. Called MD and order received to give one pain pill. Returned to patient's room and he is now feeling slightly nauseated and still quite sleepy. Cold cloth to forehead and comfortably in recliner. Father at his side. VSS. In talking to patient he dozes off and starts to snore. Informed patient and his father will continue iv fluid and monitor patient closely. If he is still hurting when wakes up more will administer pain med. Currently sleeping in chair with no more c/o nausea.

## 2014-02-03 NOTE — Discharge Instructions (Signed)
See [facility] discharge instructions in chart.  

## 2014-02-03 NOTE — Progress Notes (Signed)
Patient more awake and took one vicodin at [id number]. Has voided. States pain is better and is ready to go home. Patient and father aware of d/c instructions. Home with father.

## 2014-02-03 NOTE — Op Note (Signed)
See [facility] OP note scanned into chart. 

## 2014-12-30 ENCOUNTER — Emergency Department (HOSPITAL_COMMUNITY)
Admission: EM | Admit: 2014-12-30 | Discharge: 2014-12-30 | Disposition: A | Discharge status: Home / Self Care | Payer: BC Managed Care – PPO | Attending: Emergency Medicine | Admitting: Emergency Medicine

## 2014-12-30 ENCOUNTER — Emergency Department (HOSPITAL_COMMUNITY): Payer: BC Managed Care – PPO

## 2014-12-30 ENCOUNTER — Encounter (HOSPITAL_COMMUNITY): Payer: Self-pay | Admitting: Emergency Medicine

## 2014-12-30 DIAGNOSIS — N201 Calculus of ureter: Secondary | ICD-10-CM | POA: Diagnosis not present

## 2014-12-30 DIAGNOSIS — N200 Calculus of kidney: Secondary | ICD-10-CM | POA: Insufficient documentation

## 2014-12-30 DIAGNOSIS — I10 Essential (primary) hypertension: Secondary | ICD-10-CM | POA: Insufficient documentation

## 2014-12-30 DIAGNOSIS — R109 Unspecified abdominal pain: Secondary | ICD-10-CM

## 2014-12-30 DIAGNOSIS — Z8709 Personal history of other diseases of the respiratory system: Secondary | ICD-10-CM | POA: Insufficient documentation

## 2014-12-30 LAB — BASIC METABOLIC PANEL
Anion gap: 5 (ref 5–15)
BUN: 17 mg/dL (ref 6–20)
CALCIUM: 9 mg/dL (ref 8.9–10.3)
CHLORIDE: 109 mmol/L (ref 101–111)
CO2: 25 mmol/L (ref 22–32)
Creatinine, Ser: 1.18 mg/dL (ref 0.61–1.24)
GFR calc non Af Amer: >60 mL/min (ref >60)
Glucose, Bld: 90 mg/dL (ref 65–99)
Potassium: 3.9 mmol/L (ref 3.5–5.1)
SODIUM: 139 mmol/L (ref 135–145)

## 2014-12-30 LAB — CBC WITH DIFFERENTIAL/PLATELET
BASOS PCT: 1 %
Basophils Absolute: 0 10*3/uL (ref 0.0–0.1)
EOS PCT: 1 %
Eosinophils Absolute: 0.1 10*3/uL (ref 0.0–0.7)
HCT: 48.7 % (ref 39.0–52.0)
Hemoglobin: 16.3 g/dL (ref 13.0–17.0)
LYMPHS ABS: 1.8 10*3/uL (ref 0.7–4.0)
Lymphocytes Relative: 25 %
MCH: 31.2 pg (ref 26.0–34.0)
MCHC: 33.5 g/dL (ref 30.0–36.0)
MCV: 93.3 fL (ref 78.0–100.0)
MONOS PCT: 13 %
Monocytes Absolute: 1 10*3/uL (ref 0.1–1.0)
Neutro Abs: 4.3 10*3/uL (ref 1.7–7.7)
Neutrophils Relative %: 60 %
PLATELETS: 225 10*3/uL (ref 150–400)
RBC: 5.22 MIL/uL (ref 4.22–5.81)
RDW: 12.4 % (ref 11.5–15.5)
WBC: 7.1 10*3/uL (ref 4.0–10.5)

## 2014-12-30 LAB — URINALYSIS, ROUTINE W REFLEX MICROSCOPIC
BILIRUBIN URINE: NEGATIVE
GLUCOSE, UA: NEGATIVE mg/dL
Ketones, ur: NEGATIVE mg/dL
Leukocytes, UA: NEGATIVE
Nitrite: NEGATIVE
PROTEIN: NEGATIVE mg/dL
Specific Gravity, Urine: 1.023 (ref 1.005–1.030)
UROBILINOGEN UA: 0.2 mg/dL (ref 0.0–1.0)
pH: 5.5 (ref 5.0–8.0)

## 2014-12-30 LAB — I-STAT CHEM 8, ED
BUN: 18 mg/dL (ref 6–20)
CALCIUM ION: 1.2 mmol/L (ref 1.12–1.23)
CHLORIDE: 105 mmol/L (ref 101–111)
Creatinine, Ser: 1.2 mg/dL (ref 0.61–1.24)
GLUCOSE: 86 mg/dL (ref 65–99)
HCT: 52 % (ref 39.0–52.0)
Hemoglobin: 17.7 g/dL — ABNORMAL HIGH (ref 13.0–17.0)
Potassium: 3.9 mmol/L (ref 3.5–5.1)
SODIUM: 141 mmol/L (ref 135–145)
TCO2: 23 mmol/L (ref 0–100)

## 2014-12-30 LAB — URINE MICROSCOPIC-ADD ON

## 2014-12-30 MED ORDER — ONDANSETRON HCL 4 MG/2ML IJ SOLN
4.0000 mg | Freq: Once | INTRAMUSCULAR | Status: AC
  Administered 2014-12-30: 4 mg via INTRAVENOUS
  Filled 2014-12-30: qty 2

## 2014-12-30 MED ORDER — HYDROMORPHONE HCL 1 MG/ML IJ SOLN
1.0000 mg | Freq: Once | INTRAMUSCULAR | Status: AC
  Administered 2014-12-30: 1 mg via INTRAVENOUS
  Filled 2014-12-30: qty 1

## 2014-12-30 MED ORDER — ONDANSETRON HCL 4 MG PO TABS: 4.0000 mg | ORAL_TABLET | Freq: Three times a day (TID) | ORAL | Status: DC | PRN

## 2014-12-30 MED ORDER — OXYCODONE-ACETAMINOPHEN 5-325 MG PO TABS: 1.0000 | ORAL_TABLET | ORAL | Status: DC | PRN

## 2014-12-30 MED ORDER — TAMSULOSIN HCL 0.4 MG PO CAPS: 0.4000 mg | ORAL_CAPSULE | Freq: Two times a day (BID) | ORAL | Status: DC

## 2014-12-30 MED ORDER — KETOROLAC TROMETHAMINE 30 MG/ML IJ SOLN
30.0000 mg | Freq: Once | INTRAMUSCULAR | Status: AC
  Administered 2014-12-30: 30 mg via INTRAVENOUS
  Filled 2014-12-30: qty 1

## 2014-12-30 NOTE — Discharge Instructions (Signed)
Kidney Stones °Kidney stones (urolithiasis) are deposits that form inside your kidneys. The intense pain is caused by the stone moving through the urinary tract. When the stone moves, the ureter goes into spasm around the stone. The stone is usually passed in the urine.  °CAUSES  °· A disorder that makes certain neck glands produce too much parathyroid hormone (primary hyperparathyroidism). °· A buildup of uric acid crystals, similar to gout in your joints. °· Narrowing (stricture) of the ureter. °· A kidney obstruction present at birth (congenital obstruction). °· Previous surgery on the kidney or ureters. °· Numerous kidney infections. °SYMPTOMS  °· Feeling sick to your stomach (nauseous). °· Throwing up (vomiting). °· Blood in the urine (hematuria). °· Pain that usually spreads (radiates) to the groin. °· Frequency or urgency of urination. °DIAGNOSIS  °· Taking a history and physical exam. °· Blood or urine tests. °· CT scan. °· Occasionally, an examination of the inside of the urinary bladder (cystoscopy) is performed. °TREATMENT  °· Observation. °· Increasing your fluid intake. °· Extracorporeal shock wave lithotripsy--This is a noninvasive procedure that uses shock waves to break up kidney stones. °· Surgery may be needed if you have severe pain or persistent obstruction. There are various surgical procedures. Most of the procedures are performed with the use of small instruments. Only small incisions are needed to accommodate these instruments, so recovery time is minimized. °The size, location, and chemical composition are all important variables that will determine the proper choice of action for you. Talk to your health care provider to better understand your situation so that you will minimize the risk of injury to yourself and your kidney.  °HOME CARE INSTRUCTIONS  °· Drink enough water and fluids to keep your urine clear or pale yellow. This will help you to pass the stone or stone fragments. °· Strain  all urine through the provided strainer. Keep all particulate matter and stones for your health care provider to see. The stone causing the pain may be as small as a grain of salt. It is very important to use the strainer each and every time you pass your urine. The collection of your stone will allow your health care provider to analyze it and verify that a stone has actually passed. The stone analysis will often identify what you can do to reduce the incidence of recurrences. °· Only take over-the-counter or prescription medicines for pain, discomfort, or fever as directed by your health care provider. °· Make a follow-up appointment with your health care provider as directed. °· Get follow-up X-rays if required. The absence of pain does not always mean that the stone has passed. It may have only stopped moving. If the urine remains completely obstructed, it can cause loss of kidney function or even complete destruction of the kidney. It is your responsibility to make sure X-rays and follow-ups are completed. Ultrasounds of the kidney can show blockages and the status of the kidney. Ultrasounds are not associated with any radiation and can be performed easily in a matter of minutes. °SEEK MEDICAL CARE IF: °· You experience pain that is progressive and unresponsive to any pain medicine you have been prescribed. °SEEK IMMEDIATE MEDICAL CARE IF:  °· Pain cannot be controlled with the prescribed medicine. °· You have a fever or shaking chills. °· The severity or intensity of pain increases over 18 hours and is not relieved by pain medicine. °· You develop a new onset of abdominal pain. °· You feel faint or pass out. °·   You are unable to urinate. °MAKE SURE YOU:  °· Understand these instructions. °· Will watch your condition. °· Will get help right away if you are not doing well or get worse. °Document Released: 03/14/2005 Document Revised: 11/14/2012 Document Reviewed: 08/15/2012 °ExitCare® Patient Information ©2015  [employer]. This information is not intended to replace advice given to you by your health care provider. Make sure you discuss any questions you have with your health care provider. °Flank Pain °Flank pain refers to pain that is located on the side of the body between the upper abdomen and the back. The pain may occur over a short period of time (acute) or may be long-term or reoccurring (chronic). It may be mild or severe. Flank pain can be caused by many things. °CAUSES  °Some of the more common causes of flank pain include: °· Muscle strains.   °· Muscle spasms.   °· A disease of your spine (vertebral disk disease).   °· A lung infection (pneumonia).   °· Fluid around your lungs (pulmonary edema).   °· A kidney infection.   °· Kidney stones.   °· A very painful skin rash caused by the chickenpox virus (shingles).   °· Gallbladder disease.   °HOME CARE INSTRUCTIONS  °Home care will depend on the cause of your pain. In general, °· Rest as directed by your caregiver. °· Drink enough fluids to keep your urine clear or pale yellow. °· Only take over-the-counter or prescription medicines as directed by your caregiver. Some medicines may help relieve the pain. °· Tell your caregiver about any changes in your pain. °· Follow up with your caregiver as directed. °SEEK IMMEDIATE MEDICAL CARE IF:  °· Your pain is not controlled with medicine.   °· You have new or worsening symptoms. °· Your pain increases.   °· You have abdominal pain.   °· You have shortness of breath.   °· You have persistent nausea or vomiting.   °· You have swelling in your abdomen.   °· You feel faint or pass out.   °· You have blood in your urine. °· You have a fever or persistent symptoms for more than 2-3 days. °· You have a fever and your symptoms suddenly get worse. °MAKE SURE YOU:  °· Understand these instructions. °· Will watch your condition. °· Will get help right away if you are not doing well or get worse. °Document Released: 05/05/2005  Document Revised: 12/07/2011 Document Reviewed: 10/27/2011 °Exit[employer]® Patient Information ©2015 [employer]. This information is not intended to replace advice given to you by your health care provider. Make sure you discuss any questions you have with your health care provider. ° °

## 2014-12-30 NOTE — ED Provider Notes (Signed)
CSN: [id number]     Arrival date & time 12/30/14  1013 History   First MD Initiated Contact with Patient 12/30/14 1106     Chief Complaint  Patient presents with  . Flank Pain     (Consider location/radiation/quality/duration/timing/severity/associated sxs/prior Treatment) HPI  Kevin Herman is a 39 year old male who presents with left flank pain. The patient states that he first noticed the pain about 2 weeks ago. He went to the urologist then and had a KUB done which showed nephrolithiasis. He was given some pain medication and told to monitor himself for increased pain. He states that the pain became much more severe about an hour and a half ago and he decided to come to the ED. He reports the pain as constant and sharp. He denies any alleviating or aggravating factors. He reports radiation to the left lower quadrant of the abdomen. He reports chills, cold sweating, intermittent nausea, burning with urination, some retention, and urgency. He denies diarrhea, fever, or diarrhea. He has a PMH of nephrolithiasis requiring lithotripsy.   Past Medical History  Diagnosis Date  . Hypertension   . Kidney stones   . Bronchitis 01/2013   Past Surgical History  Procedure Laterality Date  . Ureteroscopy  1999,2000,2004,2005,07/2006,09/2006,2011  . Lithotripsy Right 08/12/2013   No family history on file. Social History  Substance Use Topics  . Smoking status: Never Smoker   . Smokeless tobacco: Never Used  . Alcohol Use: No    Review of Systems  Ten systems reviewed and are negative for acute change, except as noted in the HPI.     Allergies  Bee venom  Home Medications   Prior to Admission medications   Medication Sig Start Date End Date Taking? Authorizing Provider  EPINEPHrine (EPIPEN) 0.3 mg/0.3 mL DEVI Inject 0.3 mLs (0.3 mg total) into the muscle as needed. 01/06/12  Yes Azalia Bilis, MD  oxyCODONE (OXY IR/ROXICODONE) 5 MG immediate release tablet Take 5 mg by mouth every 4  (four) hours as needed for moderate pain or severe pain.   Yes Historical Provider, MD  ondansetron (ZOFRAN) 4 MG tablet Take 1 tablet (4 mg total) by mouth every 8 (eight) hours as needed for nausea or vomiting. 12/30/14   Arthor Captain, PA-C  oxyCODONE-acetaminophen (PERCOCET) 5-325 MG tablet Take 1-2 tablets by mouth every 4 (four) hours as needed. 12/30/14   Arthor Captain, PA-C  tamsulosin (FLOMAX) 0.4 MG CAPS capsule Take 1 capsule (0.4 mg total) by mouth 2 (two) times daily. 12/30/14   Myca Perno, PA-C   BP 156/99 mmHg  Pulse 83  Temp(Src) 97.8 F (36.6 C) (Oral)  Resp 16  SpO2 97% Physical Exam  Constitutional: He appears well-developed and well-nourished. No distress.  HENT:  Head: Normocephalic and atraumatic.  Eyes: Conjunctivae are normal. No scleral icterus.  Neck: Normal range of motion. Neck supple.  Cardiovascular: Normal rate, regular rhythm and normal heart sounds.   Pulmonary/Chest: Effort normal and breath sounds normal. No respiratory distress.  Abdominal: Soft. There is no tenderness.  +CVA tenderness L +LLQ ABD pain  Musculoskeletal: He exhibits no edema.  Neurological: He is alert.  Skin: Skin is warm and dry. He is not diaphoretic.  Psychiatric: His behavior is normal.  Nursing note and vitals reviewed.   ED Course  Procedures (including critical care time) Labs Review Labs Reviewed  URINALYSIS, ROUTINE W REFLEX MICROSCOPIC (NOT AT [facility]) - Abnormal; Notable for the following:    Hgb urine dipstick LARGE (*)  All other components within normal limits  URINE MICROSCOPIC-ADD ON - Abnormal; Notable for the following:    Bacteria, UA FEW (*)    All other components within normal limits  I-STAT CHEM 8, ED - Abnormal; Notable for the following:    Hemoglobin 17.7 (*)    All other components within normal limits  BASIC METABOLIC PANEL  CBC WITH DIFFERENTIAL/PLATELET    Imaging Review Ct Renal Stone Study  12/30/2014   CLINICAL DATA:  Left flank  pain for 1 day. Gross hematuria. History of kidney stones.  EXAM: CT ABDOMEN AND PELVIS WITHOUT CONTRAST  TECHNIQUE: Multidetector CT imaging of the abdomen and pelvis was performed following the standard protocol without IV contrast.  COMPARISON:  Radiographs dated 12/11/2014 and CT scan dated 01/30/2014  FINDINGS: Lower chest:  Normal.  Hepatobiliary: Inhomogeneous hepatic steatosis.  Otherwise normal.  Pancreas: Normal.  Spleen: Normal.  Adrenals/Urinary Tract: Adrenal glands are normal. 2 mm stone in the mid right kidney. 3 mm stone in the lower pole of the right kidney. 7 mm low-density lesion in the mid right kidney, unchanged.  4 mm stone in the lower pole the left kidney, unchanged. New slight left hydronephrosis with dilatation of the left ureter to the level of a 5 mm stone in the distal ureter just proximal to the bladder. The bladder is normal.  Stomach/Bowel: The large and small bowel are normal including the terminal ileum. Appendix is normal except for a tiny fecalith.  Reproductive: Normal.  Other: No free air or free fluid.  Musculoskeletal: Normal.  IMPRESSION: 5 mm stone obstructing the distal left ureter creating slight left hydronephrosis. Tiny bilateral renal calculi.  Hepatic steatosis.   Electronically Signed   By: Francene Boyers M.D.   On: 12/30/2014 13:22   I have personally reviewed and evaluated these images and lab results as part of my medical decision-making.   EKG Interpretation None      MDM   Final diagnoses:  Left flank pain  Left ureteral stone  Nephrolithiasis   Patient with multiple kidney stones. He has a 5 mm obstructive stone in the left distal ureter. No evidence of renal injury. No increase in the patient's creatinine level. He appears well controlled at this time.    Arthor Captain, PA-C 12/30/14 1510  Azalia Bilis, MD 12/30/14 2053

## 2014-12-30 NOTE — ED Notes (Signed)
Per pt, states he saw urologist 2 weeks ago and they said he had kidney stone on the left-pain went away-woke up this am with left flank pain

## 2014-12-31 ENCOUNTER — Ambulatory Visit (HOSPITAL_COMMUNITY): Payer: BC Managed Care – PPO | Admitting: Anesthesiology

## 2014-12-31 ENCOUNTER — Other Ambulatory Visit: Payer: Self-pay | Admitting: Urology

## 2014-12-31 ENCOUNTER — Encounter (HOSPITAL_COMMUNITY)
Admission: RE | Disposition: A | Discharge status: Home / Self Care | Payer: Self-pay | Source: Ambulatory Visit | Attending: Urology

## 2014-12-31 ENCOUNTER — Encounter (HOSPITAL_COMMUNITY): Payer: Self-pay | Admitting: *Deleted

## 2014-12-31 ENCOUNTER — Ambulatory Visit (HOSPITAL_COMMUNITY)
Admission: RE | Admit: 2014-12-31 | Discharge: 2014-12-31 | Disposition: A | Discharge status: Home / Self Care | Payer: BC Managed Care – PPO | Source: Ambulatory Visit | Attending: Urology | Admitting: Urology

## 2014-12-31 DIAGNOSIS — Z6829 Body mass index (BMI) 29.0-29.9, adult: Secondary | ICD-10-CM | POA: Insufficient documentation

## 2014-12-31 DIAGNOSIS — Z91030 Bee allergy status: Secondary | ICD-10-CM | POA: Insufficient documentation

## 2014-12-31 DIAGNOSIS — N134 Hydroureter: Secondary | ICD-10-CM | POA: Diagnosis present

## 2014-12-31 DIAGNOSIS — E669 Obesity, unspecified: Secondary | ICD-10-CM | POA: Insufficient documentation

## 2014-12-31 DIAGNOSIS — N201 Calculus of ureter: Secondary | ICD-10-CM

## 2014-12-31 DIAGNOSIS — I10 Essential (primary) hypertension: Secondary | ICD-10-CM | POA: Diagnosis not present

## 2014-12-31 DIAGNOSIS — N132 Hydronephrosis with renal and ureteral calculous obstruction: Secondary | ICD-10-CM | POA: Diagnosis not present

## 2014-12-31 HISTORY — PX: CYSTOSCOPY WITH URETEROSCOPY AND STENT PLACEMENT: SHX6377

## 2014-12-31 SURGERY — CYSTOURETEROSCOPY, WITH STENT INSERTION
Anesthesia: General | Laterality: Left

## 2014-12-31 MED ORDER — DEXAMETHASONE SODIUM PHOSPHATE 10 MG/ML IJ SOLN
INTRAMUSCULAR | Status: AC
  Filled 2014-12-31: qty 1

## 2014-12-31 MED ORDER — SODIUM CHLORIDE 0.9 % IJ SOLN
INTRAMUSCULAR | Status: AC
  Filled 2014-12-31: qty 10

## 2014-12-31 MED ORDER — EPHEDRINE SULFATE 50 MG/ML IJ SOLN
INTRAMUSCULAR | Status: AC
  Filled 2014-12-31: qty 1

## 2014-12-31 MED ORDER — FENTANYL CITRATE (PF) 100 MCG/2ML IJ SOLN
INTRAMUSCULAR | Status: DC | PRN
  Administered 2014-12-31 (×2): 50 ug via INTRAVENOUS

## 2014-12-31 MED ORDER — SODIUM CHLORIDE 0.9 % IR SOLN
Status: DC | PRN
  Administered 2014-12-31: 3000 mL

## 2014-12-31 MED ORDER — FENTANYL CITRATE (PF) 100 MCG/2ML IJ SOLN
25.0000 ug | INTRAMUSCULAR | Status: DC | PRN
  Administered 2014-12-31: 25 ug via INTRAVENOUS
  Filled 2014-12-31: qty 2

## 2014-12-31 MED ORDER — LACTATED RINGERS IV SOLN
INTRAVENOUS | Status: DC
  Administered 2014-12-31: 15:00:00 via INTRAVENOUS

## 2014-12-31 MED ORDER — PROPOFOL 10 MG/ML IV BOLUS
INTRAVENOUS | Status: DC | PRN
  Administered 2014-12-31: 150 mg via INTRAVENOUS

## 2014-12-31 MED ORDER — LIDOCAINE HCL (CARDIAC) 20 MG/ML IV SOLN
INTRAVENOUS | Status: AC
  Filled 2014-12-31: qty 5

## 2014-12-31 MED ORDER — ONDANSETRON HCL 4 MG/2ML IJ SOLN
INTRAMUSCULAR | Status: DC | PRN
  Administered 2014-12-31: 4 mg via INTRAVENOUS

## 2014-12-31 MED ORDER — ONDANSETRON HCL 4 MG/2ML IJ SOLN
INTRAMUSCULAR | Status: AC
  Filled 2014-12-31: qty 2

## 2014-12-31 MED ORDER — LIDOCAINE HCL (CARDIAC) 10 MG/ML IV SOLN
INTRAVENOUS | Status: DC | PRN
  Administered 2014-12-31: 100 mg via INTRAVENOUS

## 2014-12-31 MED ORDER — MIDAZOLAM HCL 5 MG/5ML IJ SOLN
INTRAMUSCULAR | Status: DC | PRN
  Administered 2014-12-31: 2 mg via INTRAVENOUS

## 2014-12-31 MED ORDER — SULFAMETHOXAZOLE-TRIMETHOPRIM 800-160 MG PO TABS: 1.0000 | ORAL_TABLET | Freq: Two times a day (BID) | ORAL | Status: DC

## 2014-12-31 MED ORDER — EPHEDRINE SULFATE 50 MG/ML IJ SOLN
INTRAMUSCULAR | Status: DC | PRN
  Administered 2014-12-31 (×2): 5 mg via INTRAVENOUS

## 2014-12-31 MED ORDER — PROPOFOL 10 MG/ML IV BOLUS
INTRAVENOUS | Status: AC
  Filled 2014-12-31: qty 20

## 2014-12-31 MED ORDER — SODIUM CHLORIDE 0.9 % IR SOLN
Status: DC | PRN
  Administered 2014-12-31: 1000 mL

## 2014-12-31 MED ORDER — IOHEXOL 300 MG/ML  SOLN
INTRAMUSCULAR | Status: DC | PRN
  Administered 2014-12-31: 10 mL via INTRAVENOUS

## 2014-12-31 MED ORDER — CEFAZOLIN SODIUM-DEXTROSE 2-3 GM-% IV SOLR: 2.0000 g | Freq: Once | INTRAVENOUS | Status: DC

## 2014-12-31 MED ORDER — ARTIFICIAL TEARS OP OINT
TOPICAL_OINTMENT | OPHTHALMIC | Status: AC
  Filled 2014-12-31: qty 3.5

## 2014-12-31 MED ORDER — MIDAZOLAM HCL 2 MG/2ML IJ SOLN
INTRAMUSCULAR | Status: AC
  Filled 2014-12-31: qty 4

## 2014-12-31 MED ORDER — CEFAZOLIN SODIUM-DEXTROSE 2-3 GM-% IV SOLR
INTRAVENOUS | Status: AC
  Filled 2014-12-31: qty 50

## 2014-12-31 MED ORDER — CEFAZOLIN SODIUM-DEXTROSE 2-3 GM-% IV SOLR
INTRAVENOUS | Status: DC | PRN
  Administered 2014-12-31: 2 g via INTRAVENOUS

## 2014-12-31 MED ORDER — SUCCINYLCHOLINE CHLORIDE 20 MG/ML IJ SOLN
INTRAMUSCULAR | Status: DC | PRN
  Administered 2014-12-31: 100 mg via INTRAVENOUS

## 2014-12-31 MED ORDER — OXYCODONE-ACETAMINOPHEN 5-325 MG PO TABS: 1.0000 | ORAL_TABLET | ORAL | Status: DC | PRN

## 2014-12-31 MED ORDER — FENTANYL CITRATE (PF) 100 MCG/2ML IJ SOLN
INTRAMUSCULAR | Status: AC
  Filled 2014-12-31: qty 4

## 2014-12-31 MED ORDER — DEXAMETHASONE SODIUM PHOSPHATE 10 MG/ML IJ SOLN
INTRAMUSCULAR | Status: DC | PRN
  Administered 2014-12-31: 10 mg via INTRAVENOUS

## 2014-12-31 MED ORDER — 0.9 % SODIUM CHLORIDE (POUR BTL) OPTIME
TOPICAL | Status: DC | PRN
  Administered 2014-12-31: 1000 mL

## 2014-12-31 SURGICAL SUPPLY — 15 items
BAG URO CATCHER STRL LF (DRAPE) ×3 IMPLANT
CATH URET 5FR 28IN CONE TIP (BALLOONS)
CATH URET 5FR 70CM CONE TIP (BALLOONS) ×1 IMPLANT
CLOTH BEACON ORANGE TIMEOUT ST (SAFETY) ×3 IMPLANT
EXTRACTOR STONE NITINOL NGAGE (UROLOGICAL SUPPLIES) ×2 IMPLANT
GLOVE BIOGEL M 8.0 STRL (GLOVE) ×3 IMPLANT
GOWN STRL REUS W/ TWL XL LVL3 (GOWN DISPOSABLE) ×1 IMPLANT
GOWN STRL REUS W/TWL XL LVL3 (GOWN DISPOSABLE) ×6 IMPLANT
GUIDEWIRE STR DUAL SENSOR (WIRE) ×3 IMPLANT
MANIFOLD NEPTUNE II (INSTRUMENTS) ×3 IMPLANT
PACK CYSTO (CUSTOM PROCEDURE TRAY) ×3 IMPLANT
SHEATH ACCESS URETERAL 38CM (SHEATH) ×3 IMPLANT
TUBING CONNECTING 10 (TUBING) ×2 IMPLANT
TUBING CONNECTING 10' (TUBING) ×1
WIRE COONS/BENSON .038X145CM (WIRE) ×1 IMPLANT

## 2014-12-31 NOTE — Discharge Instructions (Signed)
POSTOPERATIVE CARE AFTER URETEROSCOPY  Stent management  *Stents are often left in after ureteroscopy and stone treatment. If left in, they often cause urinary frequency, urgency, occasional blood in the urine, as well as flank discomfort with urination. These are all expected issues, and should resolve after the stent is removed. *Often times, a small thread is left on the end of the stent, and brought out through the urethra. If so, this is used to remove the stent, making it unnecessary to look in the bladder with a scope in the office to remove the stent. If a thread is left on, did not pull on it until instructed.  Diet  Once you have adequately recovered from anesthesia, you may gradually advance your diet, as tolerated, to your regular diet.  Activities  You may gradually increase your activities to your normal unrestricted level the day following your procedure.  Medications  You should resume all preoperative medications. If you are on aspirin-like compounds, you should not resume these until the blood clears from your urine. If given an antibiotic by the surgeon, take these until they are completed. You may also be given, if you have a stent, medications to decrease the urinary frequency and urgency.  Pain  After ureteroscopy, there may be some pain on the side of the scope. Take your pain medicine for this. Usually, this pain resolves within a day or 2.  Fever  Please report any fever over 100 to the doctor.       General Anesthesia, Adult, Care After Refer to this sheet in the next few weeks. These instructions provide you with information on caring for yourself after your procedure. Your health care provider may also give you more specific instructions. Your treatment has been planned according to current medical practices, but problems sometimes occur. Call your health care provider if you have any problems or questions after your procedure. WHAT TO EXPECT AFTER THE  PROCEDURE After the procedure, it is typical to experience:  Sleepiness.  Nausea and vomiting. HOME CARE INSTRUCTIONS  For the first 24 hours after general anesthesia:  Have a responsible person with you.  Do not drive a car. If you are alone, do not take public transportation.  Do not drink alcohol.  Do not take medicine that has not been prescribed by your health care provider.  Do not sign important papers or make important decisions.  You may resume a normal diet and activities as directed by your health care provider.  Change bandages (dressings) as directed.  If you have questions or problems that seem related to general anesthesia, call the hospital and ask for the anesthetist or anesthesiologist on call. SEEK MEDICAL CARE IF:  You have nausea and vomiting that continue the day after anesthesia.  You develop a rash. SEEK IMMEDIATE MEDICAL CARE IF:   You have difficulty breathing.  You have chest pain.  You have any allergic problems.   This information is not intended to replace advice given to you by your health care provider. Make sure you discuss any questions you have with your health care provider.   Document Released: 06/20/2000 Document Revised: 04/04/2014 Document Reviewed: 07/13/2011 Elsevier Interactive Patient Education [employer]

## 2014-12-31 NOTE — Anesthesia Preprocedure Evaluation (Addendum)
Anesthesia Evaluation  Patient identified by MRN, date of birth, ID band Patient awake    Reviewed: Allergy & Precautions, NPO status , Patient's Chart, lab work & pertinent test results  Airway Mallampati: II  TM Distance: >3 FB Neck ROM: Full    Dental no notable dental hx.    Pulmonary neg pulmonary ROS,    Pulmonary exam normal breath sounds clear to auscultation       Cardiovascular Exercise Tolerance: Good hypertension, negative cardio ROS Normal cardiovascular exam Rhythm:Regular Rate:Normal     Neuro/Psych negative neurological ROS  negative psych ROS   GI/Hepatic negative GI ROS, Neg liver ROS,   Endo/Other  negative endocrine ROS  Renal/GU Renal diseaseCr 1.20 K 3.9  negative genitourinary   Musculoskeletal negative musculoskeletal ROS (+)   Abdominal (+) + obese,   Peds negative pediatric ROS (+)  Hematology negative hematology ROS (+)   Anesthesia Other Findings   Reproductive/Obstetrics negative OB ROS                            Anesthesia Physical Anesthesia Plan  ASA: II  Anesthesia Plan: General   Post-op Pain Management:    Induction: Intravenous  Airway Management Planned: LMA  Additional Equipment:   Intra-op Plan:   Post-operative Plan: Extubation in OR  Informed Consent: I have reviewed the patients History and Physical, chart, labs and discussed the procedure including the risks, benefits and alternatives for the proposed anesthesia with the patient or authorized representative who has indicated his/her understanding and acceptance.   Dental advisory given  Plan Discussed with: CRNA  Anesthesia Plan Comments:         Anesthesia Quick Evaluation

## 2014-12-31 NOTE — Op Note (Signed)
Preoperative diagnosis: 5 mm left distal ureteral and smaller left renal stones  Postoperative diagnosis: Same   Procedure: Cystoscopy, left retrograde ureteropyelogram with interpretive fluoroscopy, left ureteroscopy-rigid and flexible, basket extraction of left distal ureteral and renal calculi    Surgeon: Bertram Millard. Lakisa Lotz, M.D.   Anesthesia: Gen.   Complications: None  Specimen(s): Kidney stones, to the patient's wife  Drain(s): None  Indications: 39 year old with persistently/severely symptomatic left distal ureteral stone. The patient re-presented to Dr. Isabel Caprice this morning with worsening pain and inability to keep food and fluids down. Because of significant pain and nausea/vomiting, the patient is scheduled this point for ureteroscopic management of this 5 mm left distal ureteral stone. Additionally, he has a smaller left renal stone which will be addressed at the same time. The patient is aware of risks and complications of the procedure-these include infection, bleeding, ureteral/renal injuries, risk from anesthesia. He understands these and desires proceed.    Technique and findings: The patient was properly identified and marked in the holding area. He received 2 g of Ancef intravenously. He was taken to the operating room where general anesthetic was administered. He was placed in the dorsolithotomy position. Genitalia and perineum were prepped and draped. Proper timeout was performed.  I then passed a 23 French cystoscope through the urethra. Urethra was normal and the prostate was nonobstructive. The bladder was entered and inspected circumferentially. There were no tumors, trabeculations or foreign bodies. Ureteral orifices were normal in configuration and location. The stone was visible "crowning" at the left ureteral orifice. It was barely visible, however. I then negotiated a 6 [place] open-ended catheter into the left ureteral orifice beside the stone. Was difficult to pass  by the stone, and the stone was negotiated up a little bit more proximally. Omnipaque was used to perform a retrograde ureteropyelogram.  This revealed a filling defect consistent with the previously mentioned stone in the left distal ureter. There was significant hydroureter with pyelo-calyceal notation. I saw no significant filling defects beside the previously mentioned left distal ureteral filling defect. I then negotiated a sensor-tip guidewire through the open-ended catheter, and using fluoroscopic guidance this was advanced into the upper pole calyces. The cystoscope and open-ended catheter were removed. I then passed/dilated the left distal ureter-first with the inner core of a 12/14 medium length ureteral access sheath, then with the entire unit. The distal ureter was easily dilated. I then left the guidewire in, and remove the ureteral access catheter. I then negotiated a 6 French rigid ureteroscope through the urethra into the bladder and up the left ureter. The stone was easily encountered and grasped with the engage basket. It was easily removed through the urethra. I then passed the ureteral access sheath up over the guidewire into the proximal left ureter. The inner core was removed. The 6 French flexible ureteroscope was advanced into the left pyelo-calyceal system. The entire pyelocalyceal system was inspected. Quite a few of the calyces had small Randall's plaques. No stones were seen except for one lower pole calyx. That was the identified stone on the CT scan. The engage basket was passed through the ureteroscope, and once the calyx had been properly entered, the stone was grasped and easily brought through the access sheath. Seeing that there were no further renal calculi, the access sheath was removed. The bladder was drained. I did not place a stent, as I felt the ureter was properly dilated.  Patient was taken to the PACU after his bladder was drained. He tolerated  the procedure well.

## 2014-12-31 NOTE — Anesthesia Postprocedure Evaluation (Signed)
  Anesthesia Post-op Note  Patient: Kevin Herman  Procedure(s) Performed: Procedure(s) (LRB): CYSTOSCOPY LEFT RETROGRADE PYELOGRAM WITH LEFT URETEROSCOPY AND BASKET EXTRACTION OF LEFT URETERAL STONE (Left)  Patient Location: PACU  Anesthesia Type: General  Level of Consciousness: awake and alert   Airway and Oxygen Therapy: Patient Spontanous Breathing  Post-op Pain: mild  Post-op Assessment: Post-op Vital signs reviewed, Patient's Cardiovascular Status Stable, Respiratory Function Stable, Patent Airway and No signs of Nausea or vomiting  Last Vitals:  Filed Vitals:   12/31/14 1730  BP: 153/97  Pulse: 84  Temp: 36.4 C  Resp: 15    Post-op Vital Signs: stable   Complications: No apparent anesthesia complications

## 2014-12-31 NOTE — H&P (Signed)
Urology History and Physical Exam  ZO:[id number] kidney stone  HPI: 39 year old male presents for ureteroscopic management of a significantly symptomatic 5 mm left distal ureteral stone.  He was seen back in our office today by Dr. Barron Alvine.  Dr. Ellin Goodie note is below:  Gadge presents today with a recurrent acute stone. He's had multiple surgical interventions for recurrent nephrolithiasis. He was here approximately 2-3 weeks ago and was noted to have a left proximal ureteral stone. He has been attempting to pass a stent. Yesterday his pain became unbearable and he went to the was a long emergency room. There is CT imaging confirmed a left distal ureteral stone. He has some small nonobstructing bilateral renal calculi. His pain was managed successfully in the emergency room but has recurred this morning. He has had severe ongoing nausea with dry heaves and ongoing left flank pain. I reviewed his imaging studies including KUB from 2 weeks ago and his CT scan from yesterday.    PMH: Past Medical History  Diagnosis Date  . Hypertension   . Bronchitis 01/2013  . Kidney stones     left distal ureteral stone    PSH: Past Surgical History  Procedure Laterality Date  . Ureteroscopy  1999,2000,2004,2005,07/2006,09/2006,2011  . Lithotripsy Right 08/12/2013  . Uterine stent placement Right     x 3    Allergies: Allergies  Allergen Reactions  . Bee Venom Hives    Not bee stings, yellow jacket stings breaks pt out in hives    Medications: Prescriptions prior to admission  Medication Sig Dispense Refill Last Dose  . EPINEPHrine (EPIPEN) 0.3 mg/0.3 mL DEVI Inject 0.3 mLs (0.3 mg total) into the muscle as needed. 1 Device 3 unknown  . oxyCODONE (OXY IR/ROXICODONE) 5 MG immediate release tablet Take 5 mg by mouth every 4 (four) hours as needed for moderate pain or severe pain.   12/31/2014 at 0730  . ondansetron (ZOFRAN) 4 MG tablet Take 1 tablet (4 mg total) by mouth every 8 (eight) hours  as needed for nausea or vomiting. (Patient not taking: Reported on 12/31/2014) 10 tablet 0 Not Taking at Unknown time  . oxyCODONE-acetaminophen (PERCOCET) 5-325 MG tablet Take 1-2 tablets by mouth every 4 (four) hours as needed. (Patient not taking: Reported on 12/31/2014) 20 tablet 0 Not Taking at Unknown time  . tamsulosin (FLOMAX) 0.4 MG CAPS capsule Take 1 capsule (0.4 mg total) by mouth 2 (two) times daily. (Patient not taking: Reported on 12/31/2014) 10 capsule 0 Not Taking at Unknown time     Social History: Social History   Social History  . Marital Status: Married    Spouse Name: N/A  . Number of Children: N/A  . Years of Education: N/A   Occupational History  . Not on file.   Social History Main Topics  . Smoking status: Never Smoker   . Smokeless tobacco: Never Used  . Alcohol Use: No  . Drug Use: No  . Sexual Activity: Not on file   Other Topics Concern  . Not on file   Social History Narrative    Family History: History reviewed. No pertinent family history.  Review of Systems: Positive: left flank pain, nausea, vomiting Negative: no fever or chills.  A further 10 point review of systems was negative except what is listed in the HPI.                  Physical Exam: @ General: Acute distress.  Awake. Head:  Normocephalic.  Atraumatic. ENT:  EOMI.  Mucous membranes moist Neck:  Supple.  No lymphadenopathy. CV:  S1 present. S2 present. Regular rate. Pulmonary: Equal effort bilaterally.  Clear to auscultation bilaterally. Abdomen: Soft.  Left CVA and lower quadrant tenderness Skin:  Normal turgor.  No visible rash. Extremity: No gross deformity of bilateral upper extremities.  No gross deformity of                             lower extremities. Neurologic: Alert. Appropriate mood.    Studies:  Recent Labs     12/30/14  1137  12/30/14  1144  HGB  16.3  17.7*  WBC  7.1   --   PLT  225   --     Recent Labs     12/30/14  1137  12/30/14   1144  NA  139  141  K  3.9  3.9  CL  109  105  CO2  25   --   BUN  17  18  CREATININE  1.18  1.20  CALCIUM  9.0   --   GFRNONAA  >60   --   GFRAA  >60   --      No results for input(s): INR, APTT in the last 72 hours.  Invalid input(s): PT   Invalid input(s): ABG  CT scan images were reviewed.urinalysis was clear except for 3+ blood.  Assessment:  Significantly symptomatic 5 mm left distal ureteral stone with small leftrenal stone  Plan: Cystoscopy, left retrograde ureteropyelogram, left ureteroscopic stone management, possibly with holmium laser, left renal stone extraction, possible double-J stent placement

## 2014-12-31 NOTE — Transfer of Care (Signed)
Immediate Anesthesia Transfer of Care Note  Patient: Kevin Herman  Procedure(s) Performed: Procedure(s): CYSTOSCOPY LEFT RETROGRADE PYELOGRAM WITH LEFT URETEROSCOPY AND BASKET EXTRACTION OF LEFT URETERAL STONE (Left)  Patient Location: PACU  Anesthesia Type:General  Level of Consciousness:  sedated, patient cooperative and responds to stimulation  Airway & Oxygen Therapy:Patient Spontanous Breathing and Patient connected to face mask oxgen  Post-op Assessment:  Report given to PACU RN and Post -op Vital signs reviewed and stable  Post vital signs:  Reviewed and stable  Last Vitals:  Filed Vitals:   12/31/14 1332  BP: 155/96  Pulse:   Temp:   Resp:     Complications: No apparent anesthesia complications

## 2014-12-31 NOTE — Anesthesia Procedure Notes (Signed)
Procedure Name: Intubation Date/Time: 12/31/2014 4:10 PM Performed by: Orest Dikes Pre-anesthesia Checklist: Patient identified, Emergency Drugs available, Suction available and Patient being monitored Patient Re-evaluated:Patient Re-evaluated prior to inductionOxygen Delivery Method: Circle System Utilized Preoxygenation: Pre-oxygenation with 100% oxygen Intubation Type: IV induction Ventilation: Mask ventilation without difficulty Laryngoscope Size: Mac and 4 Grade View: Grade III Tube type: Oral Tube size: 7.5 mm Number of attempts: 1 Airway Equipment and Method: Stylet Placement Confirmation: ETT inserted through vocal cords under direct vision,  positive ETCO2 and breath sounds checked- equal and bilateral Secured at: 21 cm Tube secured with: Tape Dental Injury: Teeth and Oropharynx as per pre-operative assessment

## 2015-01-01 ENCOUNTER — Encounter (HOSPITAL_COMMUNITY): Payer: Self-pay | Admitting: Urology

## 2015-01-02 NOTE — Discharge Summary (Signed)
Patient ID: Kevin Herman MRN: [id number] DOB/AGE: 12/12/1975 39 y.o.  Admit date: 12/31/2014 Discharge date: 01/02/2015  Primary Care Physician:  Candi Leash, MD  Discharge Diagnoses:   Present on Admission:  **None** Ureteral stone Consults:  None     Discharge Medications:   Medication List    STOP taking these medications        oxyCODONE 5 MG immediate release tablet  Commonly known as:  Oxy IR/ROXICODONE     tamsulosin 0.4 MG Caps capsule  Commonly known as:  FLOMAX      TAKE these medications        EPINEPHrine 0.3 mg/0.3 mL Devi  Commonly known as:  EPIPEN  Inject 0.3 mLs (0.3 mg total) into the muscle as needed.     ondansetron 4 MG tablet  Commonly known as:  ZOFRAN  Take 1 tablet (4 mg total) by mouth every 8 (eight) hours as needed for nausea or vomiting.     oxyCODONE-acetaminophen 5-325 MG tablet  Commonly known as:  PERCOCET  Take 1-2 tablets by mouth every 4 (four) hours as needed.     sulfamethoxazole-trimethoprim 800-160 MG tablet  Commonly known as:  BACTRIM DS,SEPTRA DS  Take 1 tablet by mouth 2 (two) times daily.         Significant Diagnostic Studies:  No results found.  Brief H and P: For complete details please refer to admission H and P, but in brief the pt is admitted for mgmt of a symptomatic ureteral stone  Hospital Course:  Active Problems:   * No active hospital problems. *   Day of Discharge BP 160/93 mmHg  Pulse 96  Temp(Src) 97.9 F (36.6 C) (Oral)  Resp 16  Ht  (1.753 m)  Wt 92.08 kg (203 lb)  BMI 29.96 kg/m2  SpO2 99%  No results found for this or any previous visit (from the past 24 hour(s)).  Physical Exam: General: Alert and awake oriented x3 not in any acute distress. HEENT: anicteric sclera, pupils reactive to light and accommodation CVS: S1-S2 clear no murmur rubs or gallops Chest: clear to auscultation bilaterally, no wheezing rales or rhonchi Abdomen: soft nontender, nondistended, normal  bowel sounds, no organomegaly Extremities: no cyanosis, clubbing or edema noted bilaterally Neuro: Cranial nerves II-XII intact, no focal neurological deficits  Disposition:  Home  Diet:  No restrictions  Activity:  No restrictions   Disposition and Follow-up:     Discharge Instructions    Discharge patient    Complete by:  As directed             followup in our office  TESTS THAT NEED FOLLOW-UP  N/A  DISCHARGE FOLLOW-UP Follow-up Information    Follow up with Valetta Fuller, MD.   Specialty:  Urology   Why:  As scheduled   Contact information:   [address] [place] [address] [phone]       Time spent on Discharge:  15 mins  Signed: Chelsea Aus 01/02/2015, 3:16 PM

## 2015-09-26 ENCOUNTER — Emergency Department (HOSPITAL_COMMUNITY): Payer: BC Managed Care – PPO

## 2015-09-26 ENCOUNTER — Encounter (HOSPITAL_COMMUNITY): Payer: Self-pay | Admitting: Emergency Medicine

## 2015-09-26 ENCOUNTER — Emergency Department (HOSPITAL_COMMUNITY)
Admission: EM | Admit: 2015-09-26 | Discharge: 2015-09-26 | Disposition: A | Discharge status: Home / Self Care | Payer: BC Managed Care – PPO | Attending: Emergency Medicine | Admitting: Emergency Medicine

## 2015-09-26 DIAGNOSIS — I10 Essential (primary) hypertension: Secondary | ICD-10-CM | POA: Insufficient documentation

## 2015-09-26 DIAGNOSIS — R109 Unspecified abdominal pain: Secondary | ICD-10-CM | POA: Diagnosis present

## 2015-09-26 DIAGNOSIS — N23 Unspecified renal colic: Secondary | ICD-10-CM | POA: Insufficient documentation

## 2015-09-26 DIAGNOSIS — Z79899 Other long term (current) drug therapy: Secondary | ICD-10-CM | POA: Insufficient documentation

## 2015-09-26 LAB — URINALYSIS, ROUTINE W REFLEX MICROSCOPIC
Bilirubin Urine: NEGATIVE
Glucose, UA: NEGATIVE mg/dL
Ketones, ur: NEGATIVE mg/dL
LEUKOCYTES UA: NEGATIVE
Nitrite: NEGATIVE
PROTEIN: NEGATIVE mg/dL
SPECIFIC GRAVITY, URINE: 1.016 (ref 1.005–1.030)
pH: 6 (ref 5.0–8.0)

## 2015-09-26 LAB — CBC WITH DIFFERENTIAL/PLATELET
Basophils Absolute: 0.1 10*3/uL (ref 0.0–0.1)
Basophils Relative: 1 %
Eosinophils Absolute: 0.2 10*3/uL (ref 0.0–0.7)
Eosinophils Relative: 2 %
HCT: 48.3 % (ref 39.0–52.0)
Hemoglobin: 17 g/dL (ref 13.0–17.0)
LYMPHS PCT: 29 %
Lymphs Abs: 2.6 10*3/uL (ref 0.7–4.0)
MCH: 31.6 pg (ref 26.0–34.0)
MCHC: 35.2 g/dL (ref 30.0–36.0)
MCV: 89.8 fL (ref 78.0–100.0)
Monocytes Absolute: 0.8 10*3/uL (ref 0.1–1.0)
Monocytes Relative: 9 %
NEUTROS ABS: 5.2 10*3/uL (ref 1.7–7.7)
Neutrophils Relative %: 59 %
Platelets: 215 10*3/uL (ref 150–400)
RBC: 5.38 MIL/uL (ref 4.22–5.81)
RDW: 12.5 % (ref 11.5–15.5)
WBC: 8.9 10*3/uL (ref 4.0–10.5)

## 2015-09-26 LAB — URINE MICROSCOPIC-ADD ON
Bacteria, UA: NONE SEEN
SQUAMOUS EPITHELIAL / LPF: NONE SEEN
WBC, UA: NONE SEEN WBC/hpf (ref 0–5)

## 2015-09-26 LAB — COMPREHENSIVE METABOLIC PANEL
ALK PHOS: 90 U/L (ref 38–126)
ALT: 31 U/L (ref 17–63)
AST: 29 U/L (ref 15–41)
Albumin: 4.1 g/dL (ref 3.5–5.0)
Anion gap: 9 (ref 5–15)
BILIRUBIN TOTAL: 1 mg/dL (ref 0.3–1.2)
BUN: 21 mg/dL — AB (ref 6–20)
CO2: 23 mmol/L (ref 22–32)
CREATININE: 1.44 mg/dL — AB (ref 0.61–1.24)
Calcium: 9.1 mg/dL (ref 8.9–10.3)
Chloride: 108 mmol/L (ref 101–111)
GFR calc Af Amer: >60 mL/min (ref >60)
GFR, EST NON AFRICAN AMERICAN: 60 mL/min — AB (ref >60)
Glucose, Bld: 125 mg/dL — ABNORMAL HIGH (ref 65–99)
Potassium: 4.6 mmol/L (ref 3.5–5.1)
Sodium: 140 mmol/L (ref 135–145)
TOTAL PROTEIN: 7.8 g/dL (ref 6.5–8.1)

## 2015-09-26 MED ORDER — SODIUM CHLORIDE 0.9 % IV BOLUS (SEPSIS)
1000.0000 mL | Freq: Once | INTRAVENOUS | Status: AC
  Administered 2015-09-26: 1000 mL via INTRAVENOUS

## 2015-09-26 MED ORDER — TAMSULOSIN HCL 0.4 MG PO CAPS: 0.4000 mg | ORAL_CAPSULE | Freq: Every day | ORAL | Status: DC

## 2015-09-26 MED ORDER — HYDROMORPHONE HCL 1 MG/ML IJ SOLN
1.0000 mg | Freq: Once | INTRAMUSCULAR | Status: AC
  Administered 2015-09-26: 1 mg via INTRAVENOUS
  Filled 2015-09-26: qty 1

## 2015-09-26 MED ORDER — KETOROLAC TROMETHAMINE 30 MG/ML IJ SOLN
15.0000 mg | Freq: Once | INTRAMUSCULAR | Status: AC
  Administered 2015-09-26: 15 mg via INTRAVENOUS
  Filled 2015-09-26: qty 1

## 2015-09-26 MED ORDER — ONDANSETRON HCL 4 MG/2ML IJ SOLN
4.0000 mg | Freq: Once | INTRAMUSCULAR | Status: AC
  Administered 2015-09-26: 4 mg via INTRAVENOUS
  Filled 2015-09-26: qty 2

## 2015-09-26 MED ORDER — OXYCODONE HCL 5 MG PO TABA: 5.0000 mg | ORAL_TABLET | Freq: Four times a day (QID) | ORAL | Status: DC | PRN

## 2015-09-26 NOTE — ED Provider Notes (Signed)
CSN: [id number]     Arrival date & time 09/26/15  1449 History   First MD Initiated Contact with Patient 09/26/15 1458     Chief Complaint  Patient presents with  . Flank Pain     (Consider location/radiation/quality/duration/timing/severity/associated sxs/prior Treatment) The history is provided by the patient.  Kevin Herman is a 40 y.o. male hx of HTN, kidney stone Status post ureteroscopy who presented with right flank pain. Right flank pain since last night. Associated with some nausea but no vomiting. He states that this is similar to his previous kidney stone. He denies any hematuria or dysuria. Denies any fevers or chills. States that had bilateral kidney stones before and required ureteroscopy multiple times, most recently October last year.  Took oxycodone with no relief today    Past Medical History  Diagnosis Date  . Hypertension   . Bronchitis 01/2013  . Kidney stones     left distal ureteral stone   Past Surgical History  Procedure Laterality Date  . Ureteroscopy  1999,2000,2004,2005,07/2006,09/2006,2011    ureteral stent x 3  . Lithotripsy Right 08/12/2013  . Cystoscopy with ureteroscopy and stent placement Left 12/31/2014    Procedure: CYSTOSCOPY LEFT RETROGRADE PYELOGRAM WITH LEFT URETEROSCOPY AND BASKET EXTRACTION OF LEFT URETERAL STONE;  Surgeon: Marcine MatarStephen Dahlstedt, MD;  Location: [facility];  Service: Urology;  Laterality: Left;   No family history on file. Social History  Substance Use Topics  . Smoking status: Never Smoker   . Smokeless tobacco: Never Used  . Alcohol Use: No    Review of Systems  Genitourinary: Positive for flank pain.  All other systems reviewed and are negative.     Allergies  Bee venom  Home Medications   Prior to Admission medications   Medication Sig Start Date End Date Taking? Authorizing Provider  EPINEPHrine (EPIPEN) 0.3 mg/0.3 mL DEVI Inject 0.3 mLs (0.3 mg total) into the muscle as needed. 01/06/12  Yes Azalia BilisKevin Campos, MD   lisinopril (PRINIVIL,ZESTRIL) 10 MG tablet Take 10 mg by mouth daily. 11/11/14 11/11/15 Yes Historical Provider, MD   BP 172/113 mmHg  Pulse 81  Temp(Src) 97.9 F (36.6 C) (Oral)  Resp 18  SpO2 98% Physical Exam  Constitutional: He is oriented to person, place, and time.  Uncomfortable   HENT:  Head: Normocephalic.  Mouth/Throat: Oropharynx is clear and moist.  Eyes: Conjunctivae are normal. Pupils are equal, round, and reactive to light.  Neck: Normal range of motion. Neck supple.  Cardiovascular: Normal rate, regular rhythm and normal heart sounds.   Pulmonary/Chest: Effort normal and breath sounds normal. No respiratory distress. He has no wheezes. He has no rales.  Abdominal: Soft. Bowel sounds are normal. He exhibits no distension. There is no tenderness. There is no rebound.  + R CVAT   Musculoskeletal: Normal range of motion. He exhibits no edema or tenderness.  Neurological: He is alert and oriented to person, place, and time. No cranial nerve deficit. Coordination normal.  Skin: Skin is warm.  Psychiatric: He has a normal mood and affect. His behavior is normal. Judgment and thought content normal.  Nursing note and vitals reviewed.   ED Course  Procedures (including critical care time) Labs Review Labs Reviewed  URINALYSIS, ROUTINE W REFLEX MICROSCOPIC (NOT AT [facility]) - Abnormal; Notable for the following:    Hgb urine dipstick MODERATE (*)    All other components within normal limits  COMPREHENSIVE METABOLIC PANEL - Abnormal; Notable for the following:    Glucose, Bld 125 (*)  BUN 21 (*)    Creatinine, Ser 1.44 (*)    GFR calc non Af Amer 60 (*)    All other components within normal limits  CBC WITH DIFFERENTIAL/PLATELET  URINE MICROSCOPIC-ADD ON    Imaging Review Ct Renal Stone Study  09/26/2015  CLINICAL DATA:  Right-sided flank pain for several hours, initial encounter EXAM: CT ABDOMEN AND PELVIS WITHOUT CONTRAST TECHNIQUE: Multidetector CT imaging of the  abdomen and pelvis was performed following the standard protocol without IV contrast. COMPARISON:  None. FINDINGS: Lower chest:  No acute findings. Hepatobiliary: No mass visualized on this un-enhanced exam. Pancreas: No mass or inflammatory process identified on this un-enhanced exam. Spleen: Within normal limits in size. Adrenals/Urinary Tract: Tiny calculi are noted in the left kidney. No obstructive changes are seen. The left ureter is within normal limits. On the right, there are multiple renal calculi which are nonobstructing in nature. The largest of these measures approximately 3 mm. Hydronephrosis is noted on the right secondary to a 5 mm mid right ureteral stone. The more distal ureter is within normal limits. The bladder is within normal limits. Stomach/Bowel: No evidence of obstruction, inflammatory process, or abnormal fluid collections. Changes consistent with prior appendectomy are noted. Vascular/Lymphatic: No pathologically enlarged lymph nodes. No evidence of abdominal aortic aneurysm. Reproductive: No mass or other significant abnormality. Other: None. Musculoskeletal:  No suspicious bone lesions identified. IMPRESSION: 5 mm right mid ureteral stone with moderate hydronephrosis. Multiple tiny nonobstructing stones are seen bilaterally. Electronically Signed   By: Alcide CleverMark  Lukens M.D.   On: 09/26/2015 16:01   I have personally reviewed and evaluated these images and lab results as part of my medical decision-making.   EKG Interpretation None      MDM   Final diagnoses:  None    Kevin Herman is a 40 y.o. male here with R flank pain. Hx of kidney stone requiring surgeries in the past. Will get labs, CT renal stone, UA. Will give pain meds and reassess.   4:39 PM Pain improved. CT showed 5 mm R ureteral stone. Cr. 1.44, baseline 1.1, likely from R hydro. UA showed no infection. Hypertensive likely from pain and will have him follow up outpatient. Has follow up with urology. Will dc home  with flomax, oxycodone.    Richardean Canalavid H Lissie Hinesley, MD 09/26/15 [phone]

## 2015-09-26 NOTE — ED Notes (Signed)
MD at bedside. 

## 2015-09-26 NOTE — ED Notes (Signed)
Pt reminded of the need or a urine specimen and given a bedside urinal.

## 2015-09-26 NOTE — ED Notes (Signed)
Pt reports right flank pain and nausea; hx of kidney stones.

## 2015-09-26 NOTE — ED Notes (Signed)
Pt transported to CT ?

## 2015-09-26 NOTE — Discharge Instructions (Signed)
Take motrin for pain.  Take oxycodone for severe pain.   Take flomax daily.  See urologist for follow up next week if you still has pain.   Return to ER if you have severe abdominal pain, flank pain, fever, vomiting, trouble urinating

## 2016-02-25 ENCOUNTER — Emergency Department (HOSPITAL_COMMUNITY)
Admission: EM | Admit: 2016-02-25 | Discharge: 2016-02-25 | Disposition: A | Discharge status: Home / Self Care | Payer: BC Managed Care – PPO | Attending: Emergency Medicine | Admitting: Emergency Medicine

## 2016-02-25 ENCOUNTER — Encounter (HOSPITAL_COMMUNITY): Payer: Self-pay | Admitting: *Deleted

## 2016-02-25 ENCOUNTER — Emergency Department (HOSPITAL_COMMUNITY): Payer: BC Managed Care – PPO

## 2016-02-25 DIAGNOSIS — N200 Calculus of kidney: Secondary | ICD-10-CM | POA: Insufficient documentation

## 2016-02-25 DIAGNOSIS — R109 Unspecified abdominal pain: Secondary | ICD-10-CM | POA: Diagnosis present

## 2016-02-25 DIAGNOSIS — I10 Essential (primary) hypertension: Secondary | ICD-10-CM | POA: Insufficient documentation

## 2016-02-25 LAB — URINALYSIS, ROUTINE W REFLEX MICROSCOPIC
Bilirubin Urine: NEGATIVE
Glucose, UA: NEGATIVE mg/dL
KETONES UR: NEGATIVE mg/dL
Leukocytes, UA: NEGATIVE
NITRITE: NEGATIVE
PROTEIN: NEGATIVE mg/dL
Specific Gravity, Urine: 1.02 (ref 1.005–1.030)
pH: 6.5 (ref 5.0–8.0)

## 2016-02-25 LAB — I-STAT CHEM 8, ED
BUN: 18 mg/dL (ref 6–20)
CALCIUM ION: 1.08 mmol/L — AB (ref 1.15–1.40)
CHLORIDE: 105 mmol/L (ref 101–111)
CREATININE: 1.1 mg/dL (ref 0.61–1.24)
GLUCOSE: 91 mg/dL (ref 65–99)
HCT: 50 % (ref 39.0–52.0)
Hemoglobin: 17 g/dL (ref 13.0–17.0)
Potassium: 4.2 mmol/L (ref 3.5–5.1)
Sodium: 141 mmol/L (ref 135–145)
TCO2: 26 mmol/L (ref 0–100)

## 2016-02-25 LAB — URINE MICROSCOPIC-ADD ON: SQUAMOUS EPITHELIAL / LPF: NONE SEEN

## 2016-02-25 MED ORDER — HYDROMORPHONE HCL 1 MG/ML IJ SOLN
1.0000 mg | Freq: Once | INTRAMUSCULAR | Status: AC
  Administered 2016-02-25: 1 mg via INTRAVENOUS
  Filled 2016-02-25: qty 1

## 2016-02-25 MED ORDER — KETOROLAC TROMETHAMINE 30 MG/ML IJ SOLN
30.0000 mg | Freq: Once | INTRAMUSCULAR | Status: AC
  Administered 2016-02-25: 30 mg via INTRAVENOUS
  Filled 2016-02-25: qty 1

## 2016-02-25 MED ORDER — ONDANSETRON HCL 4 MG PO TABS: 4.0000 mg | ORAL_TABLET | Freq: Four times a day (QID) | ORAL | 0 refills | Status: AC

## 2016-02-25 MED ORDER — MORPHINE SULFATE (PF) 4 MG/ML IV SOLN
4.0000 mg | Freq: Once | INTRAVENOUS | Status: DC
  Filled 2016-02-25: qty 1

## 2016-02-25 MED ORDER — LISINOPRIL 10 MG PO TABS
10.0000 mg | ORAL_TABLET | Freq: Once | ORAL | Status: AC
  Administered 2016-02-25: 10 mg via ORAL
  Filled 2016-02-25: qty 1

## 2016-02-25 MED ORDER — SODIUM CHLORIDE 0.9 % IV BOLUS (SEPSIS)
1000.0000 mL | Freq: Once | INTRAVENOUS | Status: AC
  Administered 2016-02-25: 1000 mL via INTRAVENOUS

## 2016-02-25 MED ORDER — ONDANSETRON HCL 4 MG/2ML IJ SOLN
4.0000 mg | Freq: Once | INTRAMUSCULAR | Status: AC
  Administered 2016-02-25: 4 mg via INTRAVENOUS
  Filled 2016-02-25: qty 2

## 2016-02-25 MED ORDER — OXYCODONE-ACETAMINOPHEN 5-325 MG PO TABS: 1.0000 | ORAL_TABLET | Freq: Three times a day (TID) | ORAL | 0 refills | Status: DC | PRN

## 2016-02-25 MED ORDER — TAMSULOSIN HCL 0.4 MG PO CAPS: 0.4000 mg | ORAL_CAPSULE | Freq: Every day | ORAL | 0 refills | Status: AC

## 2016-02-25 NOTE — ED Provider Notes (Signed)
[facility]-EMERGENCY DEPT Provider Note   CSN: [id number] Arrival date & time: 02/25/16  1324  History   Chief Complaint Chief Complaint  Patient presents with  . Flank Pain    HPI Kevin Herman is a 40 y.o. male.  HPI  40 y.o. male presents to the Emergency Department today complaining of right flank pain since last night. Notes radiation into right groin. Hx same. Hx kidney stones. Notes pain 7/10. Associated N/V. No fevers. No CP/SOB/ABD pain. OTC medications without relief. No other symptoms noted. Hx appendectomy.    Past Medical History:  Diagnosis Date  . Bronchitis 01/2013  . Hypertension   . Kidney stones    left distal ureteral stone    Patient Active Problem List   Diagnosis Date Noted  . Right knee pain 10/16/2013    Past Surgical History:  Procedure Laterality Date  . CYSTOSCOPY WITH URETEROSCOPY AND STENT PLACEMENT Left 12/31/2014   Procedure: CYSTOSCOPY LEFT RETROGRADE PYELOGRAM WITH LEFT URETEROSCOPY AND BASKET EXTRACTION OF LEFT URETERAL STONE;  Surgeon: Marcine Matar, MD;  Location: [facility];  Service: Urology;  Laterality: Left;  . LITHOTRIPSY Right 08/12/2013  . URETEROSCOPY  1999,2000,2004,2005,07/2006,09/2006,2011   ureteral stent x 3       Home Medications    Prior to Admission medications   Medication Sig Start Date End Date Taking? Authorizing Provider  EPINEPHrine (EPIPEN) 0.3 mg/0.3 mL DEVI Inject 0.3 mLs (0.3 mg total) into the muscle as needed. 01/06/12   Azalia Bilis, MD  OxyCODONE HCl, Abuse Deter, (OXAYDO) 5 MG TABA Take 5 mg by mouth every 6 (six) hours as needed. 09/26/15   Charlynne Pander, MD  tamsulosin (FLOMAX) 0.4 MG CAPS capsule Take 1 capsule (0.4 mg total) by mouth daily. 09/26/15   Charlynne Pander, MD    Family History No family history on file.  Social History Social History  Substance Use Topics  . Smoking status: Never Smoker  . Smokeless tobacco: Never Used  . Alcohol use No     Allergies   Bee venom   Review of  Systems Review of Systems ROS reviewed and all are negative for acute change except as noted in the HPI.  Physical Exam Updated Vital Signs BP (!) 176/116 (BP Location: Right Arm)   Pulse 78   Temp 97.8 F (36.6 C) (Oral)   Resp 16   Ht 5\' 9"  (1.753 m)   Wt 93.9 kg   SpO2 99%   BMI 30.57 kg/m   Physical Exam  Constitutional: He is oriented to person, place, and time. Vital signs are normal. He appears well-developed and well-nourished.  HENT:  Head: Normocephalic.  Right Ear: Hearing normal.  Left Ear: Hearing normal.  Eyes: Conjunctivae and EOM are normal. Pupils are equal, round, and reactive to light.  Neck: Normal range of motion. Neck supple.  Cardiovascular: Normal rate, regular rhythm, normal heart sounds and intact distal pulses.   Pulmonary/Chest: Effort normal and breath sounds normal.  Abdominal: Soft. Normal appearance and bowel sounds are normal. There is no tenderness. There is CVA tenderness (right). There is no rigidity, no rebound, no guarding, no tenderness at McBurney's point and negative Murphy's sign.  Musculoskeletal: Normal range of motion.  Neurological: He is alert and oriented to person, place, and time.  Skin: Skin is warm and dry.  Psychiatric: He has a normal mood and affect. His speech is normal and behavior is normal. Thought content normal.  Nursing note and vitals reviewed.   ED Treatments /  Results  Labs (all labs ordered are listed, but only abnormal results are displayed) Labs Reviewed  URINALYSIS, ROUTINE W REFLEX MICROSCOPIC (NOT AT [facility]) - Abnormal; Notable for the following:       Result Value   Hgb urine dipstick MODERATE (*)    All other components within normal limits  URINE MICROSCOPIC-ADD ON - Abnormal; Notable for the following:    Bacteria, UA RARE (*)    All other components within normal limits  I-STAT CHEM 8, ED   EKG  EKG Interpretation None      Radiology Ct Renal Stone Study  Result Date:  02/25/2016 CLINICAL DATA:  Right flank pain beginning last night. History of kidney stones. EXAM: CT ABDOMEN AND PELVIS WITHOUT CONTRAST TECHNIQUE: Multidetector CT imaging of the abdomen and pelvis was performed following the standard protocol without IV contrast. COMPARISON:  09/26/2015 FINDINGS: Lower chest: Dependent subsegmental atelectasis is present in the lung bases. There is no pleural effusion. Hepatobiliary: Decreased attenuation of the liver consistent with steatosis. Unremarkable gallbladder. No biliary dilatation. Pancreas: Unremarkable. Spleen: Unremarkable. Adrenals/Urinary Tract: Unremarkable adrenal glands. A 4 mm calculus is present in the mid right ureter in essentially the same location as the stone on the prior study. There is mild to moderate right hydroureteronephrosis. There are approximately 5 stones in the right kidney measuring up to 2 mm in size. Two 1 mm nonobstructing stones are present in the left kidney. The bladder is unremarkable. Stomach/Bowel: The stomach is within normal limits. An 8 mm density in the distal esophagus near the GE junction may represent an ingested medication tablet. There is no evidence of bowel obstruction or wall thickening. The appendix is absent. Vascular/Lymphatic: Normal caliber of the abdominal aorta. No enlarged lymph nodes. Reproductive: Unremarkable prostate. Other: No intraperitoneal free fluid. No abdominal wall mass or hernia. Musculoskeletal: No acute osseous abnormality or suspicious osseous lesion. IMPRESSION: 1. 4 mm obstructing mid right ureteral calculus with mild to moderate hydroureteronephrosis. 2. Tiny nonobstructing renal calculi bilaterally. Electronically Signed   By: Sebastian AcheAllen  Grady M.D.   On: 02/25/2016 10:58    Procedures Procedures (including critical care time)  Medications Ordered in ED Medications  HYDROmorphone (DILAUDID) injection 1 mg (not administered)  sodium chloride 0.9 % bolus 1,000 mL (not administered)      Initial Impression / Assessment and Plan / ED Course  I have reviewed the triage vital signs and the nursing notes.  Pertinent labs & imaging results that were available during my care of the patient were reviewed by me and considered in my medical decision making (see chart for details).  Clinical Course    Final Clinical Impressions(s) / ED Diagnoses  {I have reviewed and evaluated the relevant laboratory values. {I have reviewed and evaluated the relevant imaging studies.  {I have reviewed the relevant previous healthcare records.  {I obtained HPI from historian.   ED Course:  Assessment: Pt has been diagnosed with a Kidney Stone via CT. 4mm obstructing right renal calculi. Mild to moderate hydronephrosis. Creatinine WNL. Vitals sign stable and the pt does not have irratractable vomiting. Pt will be dc home with pain medications & has been advised to follow up with Urology.   Disposition/Plan:  DC Home Additional Verbal discharge instructions given and discussed with patient.  Pt Instructed to f/u with Urology in the next week for evaluation and treatment of symptoms. Return precautions given Pt acknowledges and agrees with plan  Supervising Physician Derwood KaplanAnkit Nanavati, MD  Final diagnoses:  Nephrolithiasis  New Prescriptions New Prescriptions   No medications on file     Audry Piliyler Adonte Vanriper, PA-C 02/25/16 1237    Derwood KaplanAnkit Nanavati, MD 02/27/16 1249

## 2016-02-25 NOTE — ED Notes (Signed)
Patient transported to Ultrasound 

## 2016-02-25 NOTE — ED Triage Notes (Signed)
Rt flank pain started last night, pt has history of stones, passed one last Friday. Pain 7/10, N/V

## 2016-02-25 NOTE — ED Notes (Signed)
Patient transported to CT 

## 2016-02-25 NOTE — Discharge Instructions (Signed)
Please read and follow all provided instructions.  Your diagnoses today include:  1. Nephrolithiasis     Tests performed today include: Urine test that showed blood in your urine and no infection CT scan which showed a 4 millimeter kidney on the right side Blood test that showed normal kidney function Vital signs. See below for your results today.   Medications prescribed:   Take any prescribed medications only as directed.  Home care instructions:  Follow any educational materials contained in this packet.  Please double your fluid intake for the next several days. Strain your urine and save any stones that may pass.   BE VERY CAREFUL not to take multiple medicines containing Tylenol (also called acetaminophen). Doing so can lead to an overdose which can damage your liver and cause liver failure and possibly death.   Follow-up instructions: Please follow-up with your urologist or the urologist referral (provided on front page) in the next 1 week for further evaluation of your symptoms.  If you need to return to the Emergency Department, go to [facility] and not [facility]. The urologists are located at [facility] and can better care for you at this location.  Return instructions:  If you need to return to the Emergency Department, go to [facility] and not [facility]. The urologists are located at [facility] and can better care for you at this location.  Please return to the Emergency Department if you experience worsening symptoms.  Please return if you develop fever or uncontrolled pain or vomiting. Please return if you have any other emergent concerns.  Additional Information:  Your vital signs today were: BP (!) 177/107    Pulse 80    Temp 97.8 F (36.6 C) (Oral)    Resp 16    Ht 5\' 9"  (1.753 m)    Wt 93.9 kg    SpO2 100%    BMI 30.57 kg/m  If your blood pressure (BP) was elevated above 135/85 this visit, please have this repeated  by your doctor within one month. --------------

## 2017-04-20 IMAGING — CT CT RENAL STONE PROTOCOL
2 of 3 series · 16 of 46 positions shown, 18 images · non-contrast
Comparison: 09/26/2015

CLINICAL DATA: Right flank pain beginning last night. History of
kidney stones.

EXAM:
CT ABDOMEN AND PELVIS WITHOUT CONTRAST
TECHNIQUE: Multidetector CT imaging of the abdomen and pelvis was performed
following the standard protocol without IV contrast.

[Series 4: lung · axial · 0.89mm/px · z∈[-64,+66]mm · 13 of 75 slices shown, 15 images]
[im 5/75  soft-tissue]
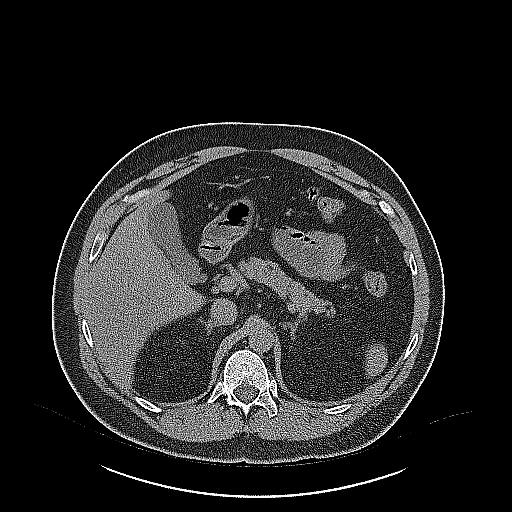
[im 5/75  bone]
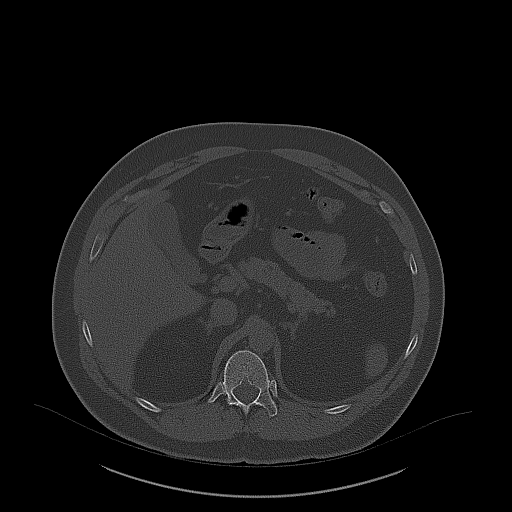
[im 10/75  soft-tissue]
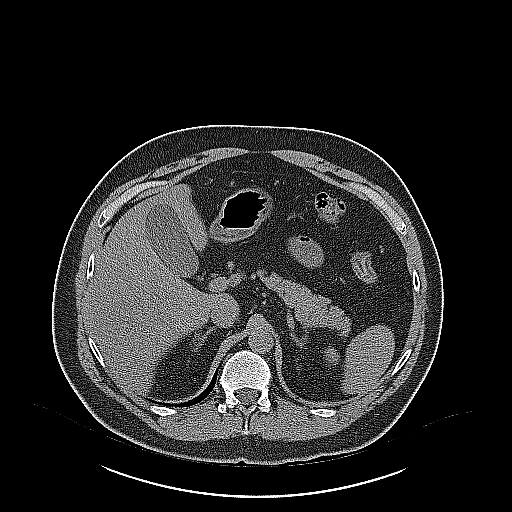
[im 15/75  soft-tissue]
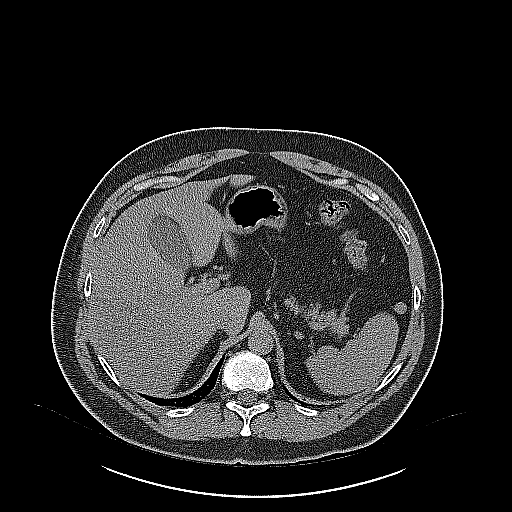
[im 22/75  soft-tissue]
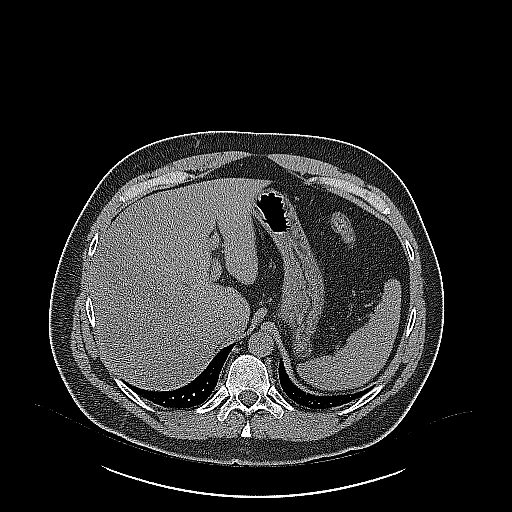
[im 27/75  soft-tissue]
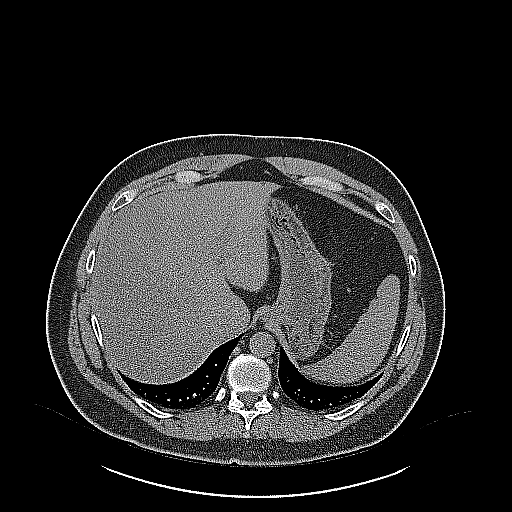
[im 32/75  soft-tissue]
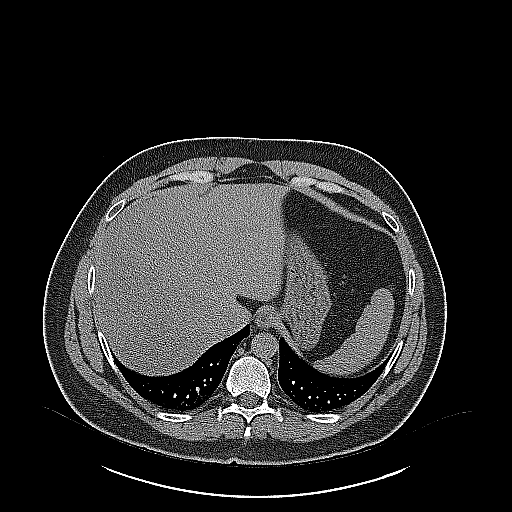
[im 39/75  soft-tissue]
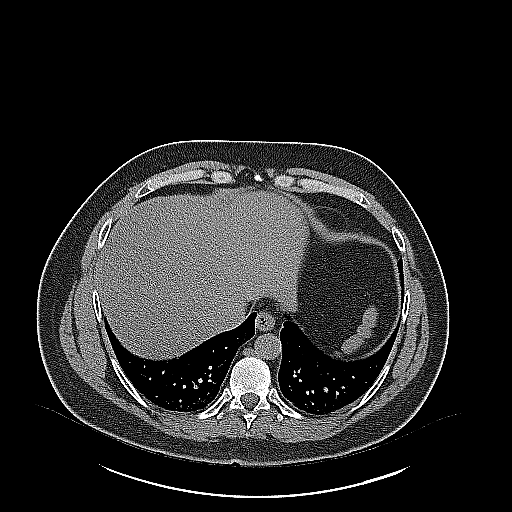
[im 43/75  soft-tissue]
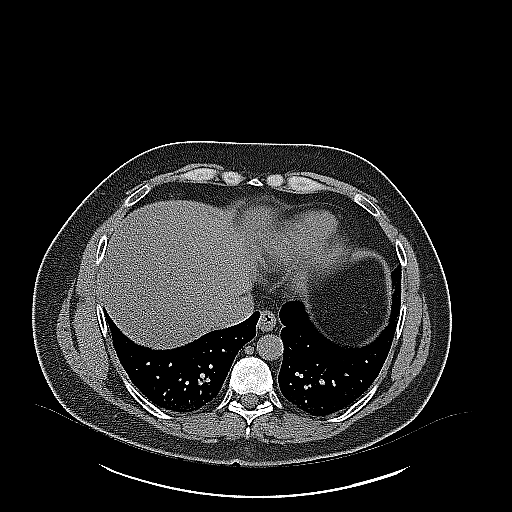
[im 48/75  soft-tissue]
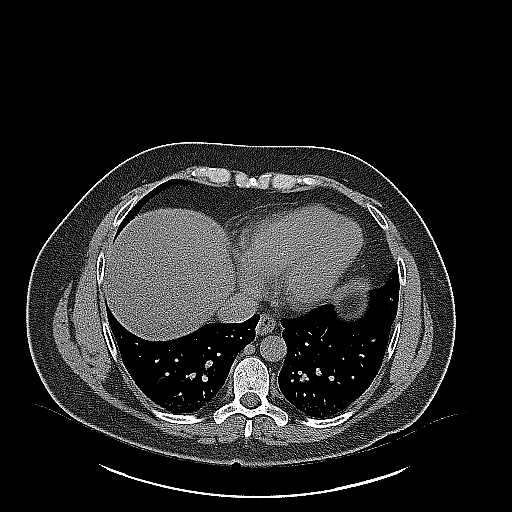
[im 48/75  bone]
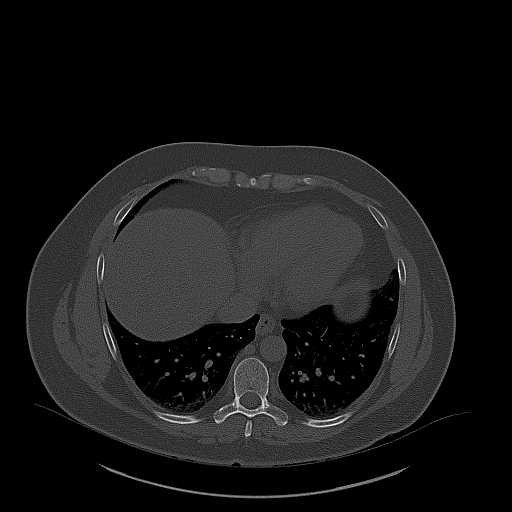
[im 53/75  soft-tissue]
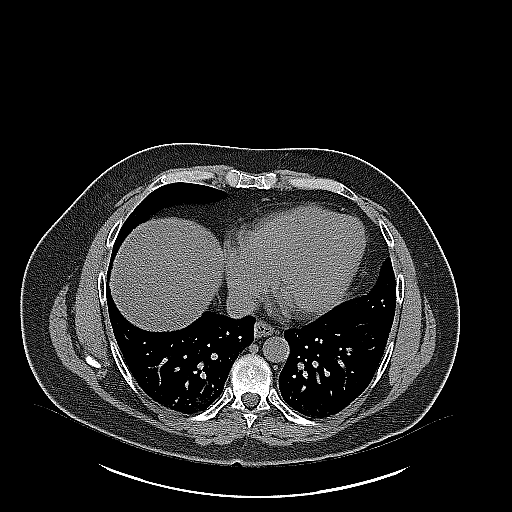
[im 60/75  soft-tissue]
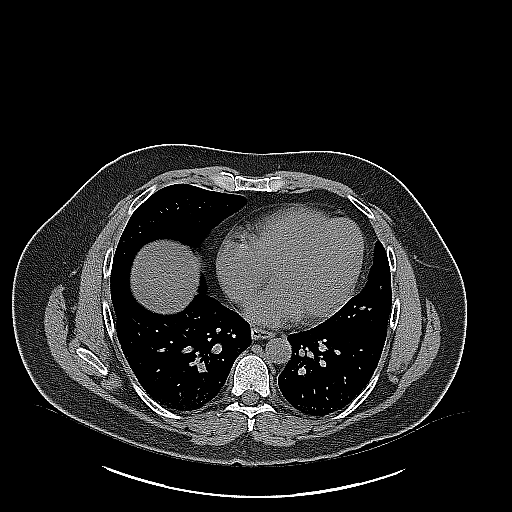
[im 65/75  soft-tissue]
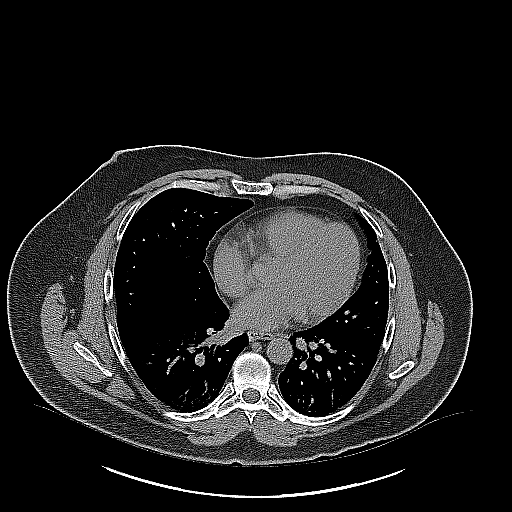
[im 70/75  soft-tissue]
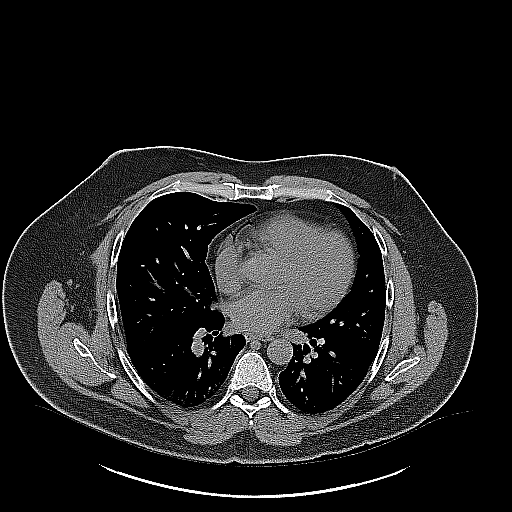

[Series 5: coronal · coronal · 0.74mm/px · 3 of 168 slices shown]
[im 56/168  soft-tissue]
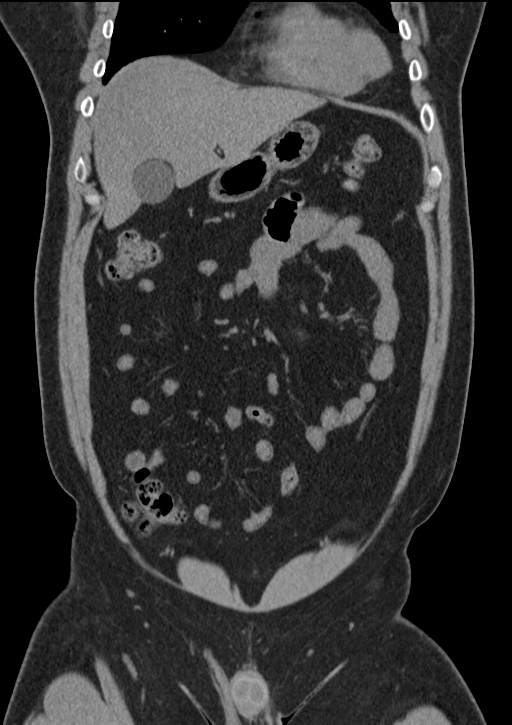
[im 75/168  soft-tissue]
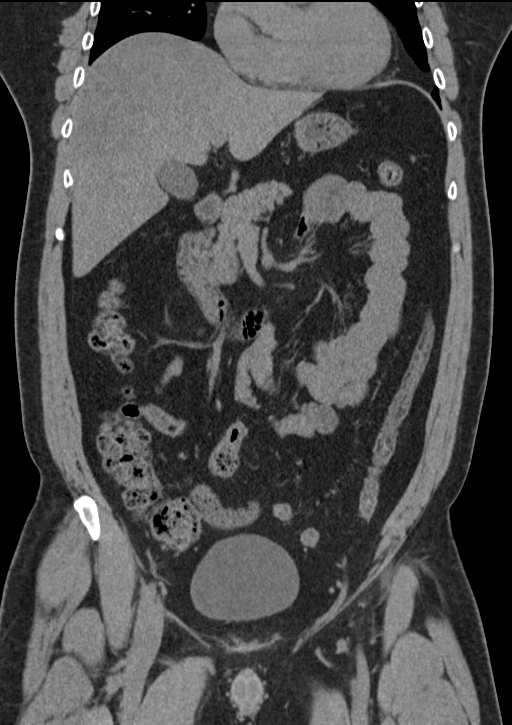
[im 93/168  soft-tissue]
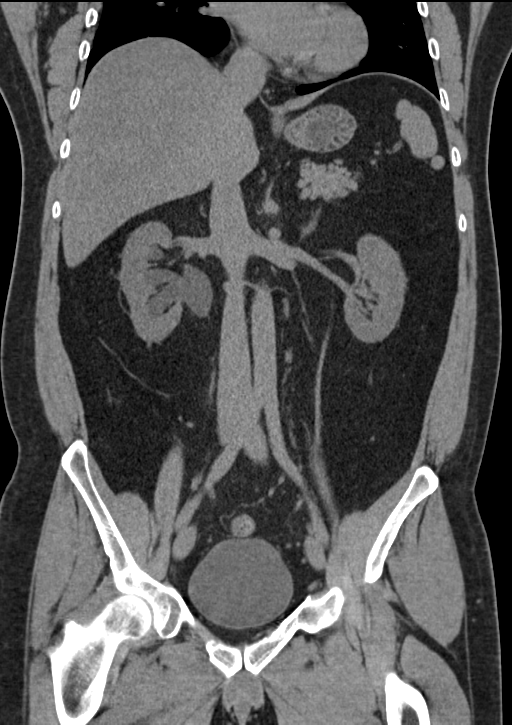

[16 of 46 positions shown; findings below may reference images not displayed]

FINDINGS: Lower chest: Dependent subsegmental atelectasis is present in the
lung bases. There is no pleural effusion.

Hepatobiliary: Decreased attenuation of the liver consistent with
steatosis. Unremarkable gallbladder. No biliary dilatation.

Pancreas: Unremarkable.

Spleen: Unremarkable.

Adrenals/Urinary Tract: Unremarkable adrenal glands. A 4 mm calculus
is present in the mid right ureter in essentially the same location
as the stone on the prior study. There is mild to moderate right
hydroureteronephrosis. There are approximately 5 stones in the right
kidney measuring up to 2 mm in size. Two 1 mm nonobstructing stones
are present in the left kidney. The bladder is unremarkable.

Stomach/Bowel: The stomach is within normal limits. An 8 mm density
in the distal esophagus near the GE junction may represent an
ingested medication tablet. There is no evidence of bowel
obstruction or wall thickening. The appendix is absent.

Vascular/Lymphatic: Normal caliber of the abdominal aorta. No
enlarged lymph nodes.

Reproductive: Unremarkable prostate.

Other: No intraperitoneal free fluid. No abdominal wall mass or
hernia.

Musculoskeletal: No acute osseous abnormality or suspicious osseous
lesion.
IMPRESSION: 1. 4 mm obstructing mid right ureteral calculus with mild to
moderate hydroureteronephrosis.
2. Tiny nonobstructing renal calculi bilaterally.

## 2019-12-13 ENCOUNTER — Other Ambulatory Visit: Payer: Self-pay | Admitting: Urology

## 2019-12-16 ENCOUNTER — Encounter (HOSPITAL_BASED_OUTPATIENT_CLINIC_OR_DEPARTMENT_OTHER): Payer: Self-pay | Admitting: Urology

## 2019-12-16 ENCOUNTER — Other Ambulatory Visit (HOSPITAL_COMMUNITY)
Admission: RE | Admit: 2019-12-16 | Discharge: 2019-12-16 | Disposition: A | Discharge status: Home / Self Care | Payer: BC Managed Care – PPO | Source: Ambulatory Visit | Attending: Urology | Admitting: Urology

## 2019-12-16 DIAGNOSIS — Z20822 Contact with and (suspected) exposure to covid-19: Secondary | ICD-10-CM | POA: Diagnosis not present

## 2019-12-16 DIAGNOSIS — Z01812 Encounter for preprocedural laboratory examination: Secondary | ICD-10-CM | POA: Insufficient documentation

## 2019-12-16 LAB — SARS CORONAVIRUS 2 (TAT 6-24 HRS): SARS Coronavirus 2: NEGATIVE

## 2019-12-16 NOTE — Progress Notes (Signed)
Patient to arrive at 1030 on 12/19/2019. History and medications reviewed. Pre-procedure instructions given. NPO after MN except clear liquids until 0830 morning of procedure. Instructed to take AM lisinopril dose.  Recently diagnosed with sleep apnea according to patient. He has ordered his CPAP but it has not arrived. Driver secured.

## 2019-12-18 NOTE — H&P (Signed)
: 10/24/2019: Patient with past medical history significant for nephrolithiasis he typically has exacerbations related to obstructive stone disease 1-2 times per year. He has previously underwent lithotripsy as well as ureteroscopy for stone treatment. Seen last winter for exacerbation of right-sided renal colic and diagnosed with a 3 mm mildly obstructing distal ureteral calculi. On follow-up KUB imaging did not show an obvious ureteral calculi on that side and patient symptoms had improved. He has not followed up since then. He was evaluated in late March in the [facility] system for right sided renal colic. CT imaging performed which showed small bilateral non-obstructing calculi as well as an obstructing 4.26mm distal right ureteral calculi. I am only able to view imaging report in regards to this.   Presents today for concerns of a possible obstructing ureteral calculus.   Historically his stones on previous analysis have been calcium oxalate.   Symptoms began this morning around 6:00 a.m.Marland Kitchen Complaining of pain/discomfort in the left lower back and flank area. No radiation to the abdomen or groin. At the onset of symptoms he took tamsulosin which has helped ease pain a little bit by his report. Not associated with increased frequency or urgency, burning or painful urination, changes in force of stream. He denies fevers or chills, nausea/vomiting   -11/27/19-patient with history of recent left lower back and flank pain. Seen by Anne Fu and treated as presumptive ureteral calculus. KUB at that time showed some renal calculi but no obvious ureteral stone. Patient has been on tamsulosin 0.4 mg daily. Currently patient is asymptomatic. Had ultrasound earlier today which showed some nonobstructing bilateral renal calculi. No evidence of hydronephrosis. KUB is reviewed today and shows calcification previously seen overlying left renal contour on 10/24/2019 now has migrated into the region of the left distal  ureter the region of L3 consistent with upper ureteral calculus. This measures 4-5 mm in size  Urinalysis today shows 10-20 RBCs  -12/12/19-patient with history of left upper ureteral calculus as above. Has been on tamsulosin as medical expulsive therapy. Here for follow-up KUB. The patient over the last week has actually developed some right-sided flank pain radiating around to the right inguinal area. He has had no nausea vomiting or fever. He has had no left-sided pain.  KUB is reviewed today shows persistent calcification in the region of L3 on the left. I see no obvious calcifications to suggest right ureteral stone. Micro urinalysis today is clear  CT urogram was subsequently obtained today and this is reviewed. By initial review this shows an approximate 5-6 mm partially obstructing stone in the region of the left upper ureter at around L3. This is outside the renal shadow. Review of the right side shows a nonobstructing 4-5 mm flat calculus but no evidence of ureteral stone or hydronephrosis on the right. Over read is pending     ALLERGIES: No Allergies    MEDICATIONS: Tamsulosin Hcl 0.4 mg capsule 1 capsule PO Daily  Atorvastatin Calcium  Hydrocodone-Acetaminophen 5 mg-325 mg tablet 1 tablet PO Q 6 H PRN  Lisinopril 10 mg tablet Oral     GU PSH: Cysto Uretero Lithotripsy - 2011 ESWL - 2015, 2015, 2008, 2008 Ureteroscopic stone removal - 2016, 2008       PSH Notes: Cystoscopy With Ureteroscopy With Removal Of Calculus, Lithotripsy, Lithotripsy, Cystoscopy With Ureteroscopy With Lithotripsy, Cystoscopy With Ureteroscopy With Manipulation Of Calculus, Lithotripsy, Lithotripsy   NON-GU PSH: None   GU PMH: Ureteral calculus (Stable) - 11/27/2019, - 2020, Calculus of ureter, -  2016, Calculus of right ureter, - 2014 Renal colic - 2020, - 2020, Renal colic, - 2017 Renal calculus - 2019, (Stable), - 2018, Nephrolithiasis, - 2017 Flank Pain - 2018, Generalized abdominal pain, - 2014 Renal  and ureteral calculus - 2017 Abdominal Pain Unspec, Left flank pain - 2016    NON-GU PMH: Encounter for general adult medical examination without abnormal findings, Encounter for preventive health examination - 2017 Personal history of other diseases of the circulatory system, History of hypertension - 2016 Cardiac murmur, unspecified, Murmurs - 2014    FAMILY HISTORY: Alzheimer's Disease - Runs In Family Cardiac Failure - Runs In Family Family Health Status Number - Runs In Family Kidney Cancer - Runs In Family skin cancer - Father Urologic Disorder - Runs In Family   SOCIAL HISTORY: Marital Status: Married Preferred Language: English; Ethnicity: Not Hispanic Or Latino; Race: White     Notes: Never A Smoker, Occupation:, Tobacco Use, Marital History - Currently Married, Caffeine Use, Alcohol Use   REVIEW OF SYSTEMS:    GU Review Male:   Patient denies frequent urination, hard to postpone urination, burning/ pain with urination, get up at night to urinate, leakage of urine, stream starts and stops, trouble starting your stream, have to strain to urinate , erection problems, and penile pain.  Gastrointestinal (Upper):   Patient denies nausea, vomiting, and indigestion/ heartburn.  Gastrointestinal (Lower):   Patient denies diarrhea and constipation.  Constitutional:   Patient denies fever, night sweats, weight loss, and fatigue.  Skin:   Patient denies skin rash/ lesion and itching.  Eyes:   Patient denies blurred vision and double vision.  Ears/ Nose/ Throat:   Patient denies sore throat and sinus problems.  Hematologic/Lymphatic:   Patient denies swollen glands and easy bruising.  Cardiovascular:   Patient denies leg swelling and chest pains.  Respiratory:   Patient denies shortness of breath and cough.  Endocrine:   Patient denies excessive thirst.  Musculoskeletal:   Patient reports back pain. Patient denies joint pain.  Neurological:   Patient denies headaches and dizziness.   Psychologic:   Patient denies depression and anxiety.   Notes: Now Right Flank pain has arrived all last week    VITAL SIGNS:      12/12/2019 10:54 AM  Weight 220 lb / 99.79 kg  Height 69 in / 175.26 cm  BP 102/68 mmHg  Heart Rate 96 /min  Temperature 97.6 F / 36.4 C  BMI 32.5 kg/m   GU PHYSICAL EXAMINATION:    Anus and Perineum: No hemorrhoids. No anal stenosis. No rectal fissure, no anal fissure. No edema, no dimple, no perineal tenderness, no anal tenderness.  Scrotum: No lesions. No edema. No cysts. No warts.  Epididymides: Right: no spermatocele, no masses, no cysts, no tenderness, no induration, no enlargement. Left: no spermatocele, no masses, no cysts, no tenderness, no induration, no enlargement.  Testes: No tenderness, no swelling, no enlargement left testes. No tenderness, no swelling, no enlargement right testes. Normal location left testes. Normal location right testes. No mass, no cyst, no varicocele, no hydrocele left testes. No mass, no cyst, no varicocele, no hydrocele right testes.  Urethral Meatus: Normal size. No lesion, no wart, no discharge, no polyp. Normal location.  Penis: Circumcised, no warts, no cracks. No dorsal Peyronie's plaques, no left corporal Peyronie's plaques, no right corporal Peyronie's plaques, no scarring, no warts. No balanitis, no meatal stenosis.  Prostate: 40 gram or 2+ size. Left lobe normal consistency, right lobe normal consistency.  Symmetrical lobes. No prostate nodule. Left lobe no tenderness, right lobe no tenderness.  Seminal Vesicles: Nonpalpable.  Sphincter Tone: Normal sphincter. No rectal tenderness. No rectal mass.    MULTI-SYSTEM PHYSICAL EXAMINATION:    Constitutional: Well-nourished. No physical deformities. Normally developed. Good grooming.  Neck: Neck symmetrical, not swollen. Normal tracheal position.  Respiratory: No labored breathing, no use of accessory muscles.   Cardiovascular: Normal temperature, normal extremity  pulses, no swelling, no varicosities.  Lymphatic: No enlargement of neck, axillae, groin.  Skin: No paleness, no jaundice, no cyanosis. No lesion, no ulcer, no rash.  Neurologic / Psychiatric: Oriented to time, oriented to place, oriented to person. No depression, no anxiety, no agitation.  Gastrointestinal: No mass, no tenderness, no rigidity, non obese abdomen.  Eyes: Normal conjunctivae. Normal eyelids.  Ears, Nose, Mouth, and Throat: Left ear no scars, no lesions, no masses. Right ear no scars, no lesions, no masses. Nose no scars, no lesions, no masses. Normal hearing. Normal lips.  Musculoskeletal: Normal gait and station of head and neck.     Complexity of Data:  Records Review:   Previous Doctor Records, Previous Patient Records  X-[place] Review: C.T. Abdomen/Pelvis: Reviewed Films. Reviewed Report. Discussed With Patient.     PROCEDURES:         C.T. ABD-Pelv w/o - [id number]      . Patient confirmed No Neulasta OnPro Device.          KUB - [id number]  A single view of the abdomen is obtained.      . Patient confirmed No Neulasta OnPro Device. KUB is reviewed today shows persistent calcification in the region of L3 on the left consistent with possible ureteral calculus measuring 4-5 mm in size. No obvious stones noted on the right side. No obvious bony abnormalities noted.          Urinalysis - 81003 Dipstick Dipstick Cont'd  Color: Yellow Bilirubin: Neg  Appearance: Clear Ketones: Neg  Specific Gravity: 1.025 Blood: Neg  pH: 5.5 Protein: Neg  Glucose: Neg Urobilinogen: 0.2    Nitrites: Neg    Leukocyte Esterase: Neg    Notes:      ASSESSMENT:      ICD-10 Details  1 GU:   Ureteral calculus - N20.1 Acute, Complicated Injury   PLAN:           Orders X-Rays: C.T. Abdomen/Pelvis Without Contrast. No Oral Contrast  X-[place] Notes: . History:  Hematuria: Yes/No  Patient to see MD after exam: Yes/No  Previous exam: CT / IVP/ US/ KUB/ None  When:  Where:  Diabetic:  Yes/ No  BUN/ Creatinine:  Date of last BUN Creatinine:  Weight in pounds:  Allergy- IV Contrast: Yes/ No  Conflicting diabetic meds: Yes/ No  Diabetic Meds:  Prior Authorization #: [id number]          Schedule         Document Letter(s):  Created for Patient: Clinical Summary         Notes:   I discussed treatment options with the patient. In that he is minimally symptomatic will try to schedule for in situ ESL in the near future. Risks and benefits discussed as outlined below.  I have discussed with the patient the risks and consequences of the procedure of extracorporeal shockwave lithotripsy to include, but not limited to: Bleeding, including bleeding in the urine, bleeding around the kidney with hematoma formation and rarely bleeding to the point of loss of the kidney, infection, damage  to the surrounding structures including soft tissue and bowel perforation, residual stone fragments requiring the need for future treatments including endoscopic surgery, open surgery or percutaneous surgery. I have emphasized that study showed that up to 25% of patients will require additional procedures depending on stone size and composition. I have also discussed with the patient that the success rate for ESWL is approximately 60-90% and depends on stone location and stone composition as well as preoperative stone size. The patient voices understanding of the risks and benefits of the above and consents to the procedure.

## 2019-12-19 ENCOUNTER — Ambulatory Visit (HOSPITAL_COMMUNITY): Payer: BC Managed Care – PPO

## 2019-12-19 ENCOUNTER — Encounter (HOSPITAL_BASED_OUTPATIENT_CLINIC_OR_DEPARTMENT_OTHER): Payer: Self-pay | Admitting: Urology

## 2019-12-19 ENCOUNTER — Other Ambulatory Visit: Payer: Self-pay

## 2019-12-19 ENCOUNTER — Encounter (HOSPITAL_BASED_OUTPATIENT_CLINIC_OR_DEPARTMENT_OTHER)
Admission: RE | Disposition: A | Discharge status: Home / Self Care | Payer: Self-pay | Source: Home / Self Care | Attending: Urology

## 2019-12-19 ENCOUNTER — Ambulatory Visit (HOSPITAL_BASED_OUTPATIENT_CLINIC_OR_DEPARTMENT_OTHER)
Admission: RE | Admit: 2019-12-19 | Discharge: 2019-12-19 | Disposition: A | Discharge status: Home / Self Care | Payer: BC Managed Care – PPO | Attending: Urology | Admitting: Urology

## 2019-12-19 DIAGNOSIS — Z8679 Personal history of other diseases of the circulatory system: Secondary | ICD-10-CM | POA: Insufficient documentation

## 2019-12-19 DIAGNOSIS — Z79899 Other long term (current) drug therapy: Secondary | ICD-10-CM | POA: Insufficient documentation

## 2019-12-19 DIAGNOSIS — Z841 Family history of disorders of kidney and ureter: Secondary | ICD-10-CM | POA: Diagnosis not present

## 2019-12-19 DIAGNOSIS — N202 Calculus of kidney with calculus of ureter: Secondary | ICD-10-CM | POA: Insufficient documentation

## 2019-12-19 DIAGNOSIS — N201 Calculus of ureter: Secondary | ICD-10-CM

## 2019-12-19 HISTORY — DX: Other specified postprocedural states: Z98.890

## 2019-12-19 HISTORY — DX: Sleep apnea, unspecified: G47.30

## 2019-12-19 HISTORY — DX: Nausea with vomiting, unspecified: R11.2

## 2019-12-19 HISTORY — PX: EXTRACORPOREAL SHOCK WAVE LITHOTRIPSY: SHX1557

## 2019-12-19 SURGERY — LITHOTRIPSY, ESWL
Anesthesia: LOCAL | Laterality: Left

## 2019-12-19 MED ORDER — DIPHENHYDRAMINE HCL 25 MG PO CAPS
ORAL_CAPSULE | ORAL | Status: AC
  Filled 2019-12-19: qty 1

## 2019-12-19 MED ORDER — DIAZEPAM 5 MG PO TABS
10.0000 mg | ORAL_TABLET | ORAL | Status: AC
  Administered 2019-12-19: 10 mg via ORAL

## 2019-12-19 MED ORDER — CIPROFLOXACIN HCL 500 MG PO TABS
500.0000 mg | ORAL_TABLET | Freq: Once | ORAL | Status: AC
  Administered 2019-12-19: 500 mg via ORAL

## 2019-12-19 MED ORDER — SODIUM CHLORIDE 0.9 % IV SOLN: INTRAVENOUS | Status: DC

## 2019-12-19 MED ORDER — KETOROLAC TROMETHAMINE 30 MG/ML IJ SOLN
60.0000 mg | Freq: Once | INTRAMUSCULAR | Status: AC
  Administered 2019-12-19: 60 mg via INTRAMUSCULAR

## 2019-12-19 MED ORDER — DIPHENHYDRAMINE HCL 25 MG PO CAPS
25.0000 mg | ORAL_CAPSULE | ORAL | Status: AC
  Administered 2019-12-19: 25 mg via ORAL

## 2019-12-19 MED ORDER — KETOROLAC TROMETHAMINE 30 MG/ML IJ SOLN
INTRAMUSCULAR | Status: AC
  Filled 2019-12-19: qty 2

## 2019-12-19 MED ORDER — OXYCODONE-ACETAMINOPHEN 5-325 MG PO TABS
1.0000 | ORAL_TABLET | Freq: Once | ORAL | Status: AC
  Administered 2019-12-19: 1 via ORAL

## 2019-12-19 MED ORDER — OXYCODONE-ACETAMINOPHEN 5-325 MG PO TABS
1.0000 | ORAL_TABLET | ORAL | 0 refills | Status: AC | PRN
Start: 2019-12-19 — End: 2020-12-18

## 2019-12-19 MED ORDER — CIPROFLOXACIN IN D5W 400 MG/200ML IV SOLN: 400.0000 mg | INTRAVENOUS | Status: DC

## 2019-12-19 MED ORDER — CIPROFLOXACIN HCL 500 MG PO TABS
ORAL_TABLET | ORAL | Status: AC
  Filled 2019-12-19: qty 1

## 2019-12-19 MED ORDER — DIAZEPAM 5 MG PO TABS
ORAL_TABLET | ORAL | Status: AC
  Filled 2019-12-19: qty 2

## 2019-12-19 MED ORDER — OXYCODONE-ACETAMINOPHEN 5-325 MG PO TABS
ORAL_TABLET | ORAL | Status: AC
  Filled 2019-12-19: qty 1

## 2019-12-19 NOTE — Brief Op Note (Signed)
12/19/2019  1:21 PM  PATIENT:  Kevin Herman  44 y.o. male  PRE-OPERATIVE DIAGNOSIS:  LEFT URETERAL STONE  POST-OPERATIVE DIAGNOSIS:  * No post-op diagnosis entered *  PROCEDURE:  Procedure(s): LEFT  EXTRACORPOREAL SHOCK WAVE LITHOTRIPSY (ESWL) (Left)  SURGEON:  Surgeon(s) and Role:    * Belva Agee, MD - Primary  PHYSICIAN ASSISTANT:   ASSISTANTS: none   ANESTHESIA:   IV sedation  EBL:  minimal   BLOOD ADMINISTERED:none  DRAINS: none   LOCAL MEDICATIONS USED:  NONE  SPECIMEN:  No Specimen  DISPOSITION OF SPECIMEN:  N/A  COUNTS:  YES  TOURNIQUET:  * No tourniquets in log *  DICTATION: .Note written in EPIC  PLAN OF CARE: Discharge to home after PACU  PATIENT DISPOSITION:  PACU - hemodynamically stable.   Delay start of Pharmacological VTE agent (>24hrs) due to surgical blood loss or risk of bleeding: not applicable

## 2019-12-19 NOTE — Discharge Instructions (Signed)
Lithotripsy, Care After This sheet gives you information about how to care for yourself after your procedure. Your health care provider may also give you more specific instructions. If you have problems or questions, contact your health care provider. What can I expect after the procedure? After the procedure, it is common to have:  Some blood in your urine. This should only last for a few days.  Soreness in your back, sides, or upper abdomen for a few days.  Blotches or bruises on your back where the pressure wave entered the skin.  Pain, discomfort, or nausea when pieces (fragments) of the kidney stone move through the tube that carries urine from the kidney to the bladder (ureter). Stone fragments may pass soon after the procedure, but they may continue to pass for up to 4-8 weeks. ? If you have severe pain or nausea, contact your health care provider. This may be caused by a large stone that was not broken up, and this may mean that you need more treatment.  Some pain or discomfort during urination.  Some pain or discomfort in the lower abdomen or (in men) at the base of the penis. Follow these instructions at home: Medicines  Take over-the-counter and prescription medicines only as told by your health care provider.  If you were prescribed an antibiotic medicine, take it as told by your health care provider. Do not stop taking the antibiotic even if you start to feel better.  Do not drive for 24 hours if you were given a medicine to help you relax (sedative).  Do not drive or use heavy machinery while taking prescription pain medicine. Eating and drinking      Drink enough water and fluids to keep your urine clear or pale yellow. This helps any remaining pieces of the stone to pass. It can also help prevent new stones from forming.  Eat plenty of fresh fruits and vegetables.  Follow instructions from your health care provider about eating and drinking restrictions. You may be  instructed: ? To reduce how much salt (sodium) you eat or drink. Check ingredients and nutrition facts on packaged foods and beverages. ? To reduce how much meat you eat.  Eat the recommended amount of calcium for your age and gender. Ask your health care provider how much calcium you should have. General instructions  Get plenty of rest.  Most people can resume normal activities 1-2 days after the procedure. Ask your health care provider what activities are safe for you.  Your health care provider may direct you to lie in a certain position (postural drainage) and tap firmly (percuss) over your kidney area to help stone fragments pass. Follow instructions as told by your health care provider.  If directed, strain all urine through the strainer that was provided by your health care provider. ? Keep all fragments for your health care provider to see. Any stones that are found may be sent to a medical lab for examination. The stone may be as small as a grain of salt.  Keep all follow-up visits as told by your health care provider. This is important. Contact a health care provider if:  You have pain that is severe or does not get better with medicine.  You have nausea that is severe or does not go away.  You have blood in your urine longer than your health care provider told you to expect.  You have more blood in your urine.  You have pain during urination that does   not go away.  You urinate more frequently than usual and this does not go away.  You develop a rash or any other possible signs of an allergic reaction. Get help right away if:  You have severe pain in your back, sides, or upper abdomen.  You have severe pain while urinating.  Your urine is very dark red.  You have blood in your stool (feces).  You cannot pass any urine at all.  You feel a strong urge to urinate after emptying your bladder.  You have a fever or chills.  You develop shortness of breath,  difficulty breathing, or chest pain.  You have severe nausea that leads to persistent vomiting.  You faint. Summary  After this procedure, it is common to have some pain, discomfort, or nausea when pieces (fragments) of the kidney stone move through the tube that carries urine from the kidney to the bladder (ureter). If this pain or nausea is severe, however, you should contact your health care provider.  Most people can resume normal activities 1-2 days after the procedure. Ask your health care provider what activities are safe for you.  Drink enough water and fluids to keep your urine clear or pale yellow. This helps any remaining pieces of the stone to pass, and it can help prevent new stones from forming.  If directed, strain your urine and keep all fragments for your health care provider to see. Fragments or stones may be as small as a grain of salt.  Get help right away if you have severe pain in your back, sides, or upper abdomen or have severe pain while urinating. This information is not intended to replace advice given to you by your health care provider. Make sure you discuss any questions you have with your health care provider. Document Revised: 06/25/2018 Document Reviewed: 02/03/2016 Elsevier Patient Education  2020 [employer]    Post Anesthesia Home Care Instructions  Activity: Get plenty of rest for the remainder of the day. A responsible individual must stay with you for 24 hours following the procedure.  For the next 24 hours, DO NOT: -Drive a car -Operate machinery -Drink alcoholic beverages -Take any medication unless instructed by your physician -Make any legal decisions or sign important papers.  Meals: Start with liquid foods such as gelatin or soup. Progress to regular foods as tolerated. Avoid greasy, spicy, heavy foods. If nausea and/or vomiting occur, drink only clear liquids until the nausea and/or vomiting subsides. Call your physician if vomiting  continues.  Special Instructions/Symptoms: Your throat may feel dry or sore from the anesthesia or the breathing tube placed in your throat during surgery. If this causes discomfort, gargle with warm salt water. The discomfort should disappear within 24 hours.  If you had a scopolamine patch placed behind your ear for the management of post- operative nausea and/or vomiting:  1. The medication in the patch is effective for 72 hours, after which it should be removed.  Wrap patch in a tissue and discard in the trash. Wash hands thoroughly with soap and water. 2. You may remove the patch earlier than 72 hours if you experience unpleasant side effects which may include dry mouth, dizziness or visual disturbances. 3. Avoid touching the patch. Wash your hands with soap and water after contact with the patch.     

## 2019-12-19 NOTE — Interval H&P Note (Signed)
History and Physical Interval Note:  12/19/2019 12:14 PM  Kevin Herman  has presented today for surgery, with the diagnosis of LEFT URETERAL STONE.  The various methods of treatment have been discussed with the patient and family. After consideration of risks, benefits and other options for treatment, the patient has consented to  Procedure(s): LEFT  EXTRACORPOREAL SHOCK WAVE LITHOTRIPSY (ESWL) (Left) as a surgical intervention.  The patient's history has been reviewed, patient examined, no change in status, stable for surgery.  I have reviewed the patient's chart and labs.  Questions were answered to the patient's satisfaction.     Kevin Herman

## 2019-12-20 ENCOUNTER — Encounter (HOSPITAL_BASED_OUTPATIENT_CLINIC_OR_DEPARTMENT_OTHER): Payer: Self-pay | Admitting: Urology

## 2021-02-11 IMAGING — DX DG ABDOMEN 1V
1 series · 1 of 1 positions shown · non-contrast
Comparison: 12/12/2019

CLINICAL DATA: Preop lithotripsy left ureteral stone.

EXAM:
ABDOMEN - 1 VIEW

[abdomen kub]
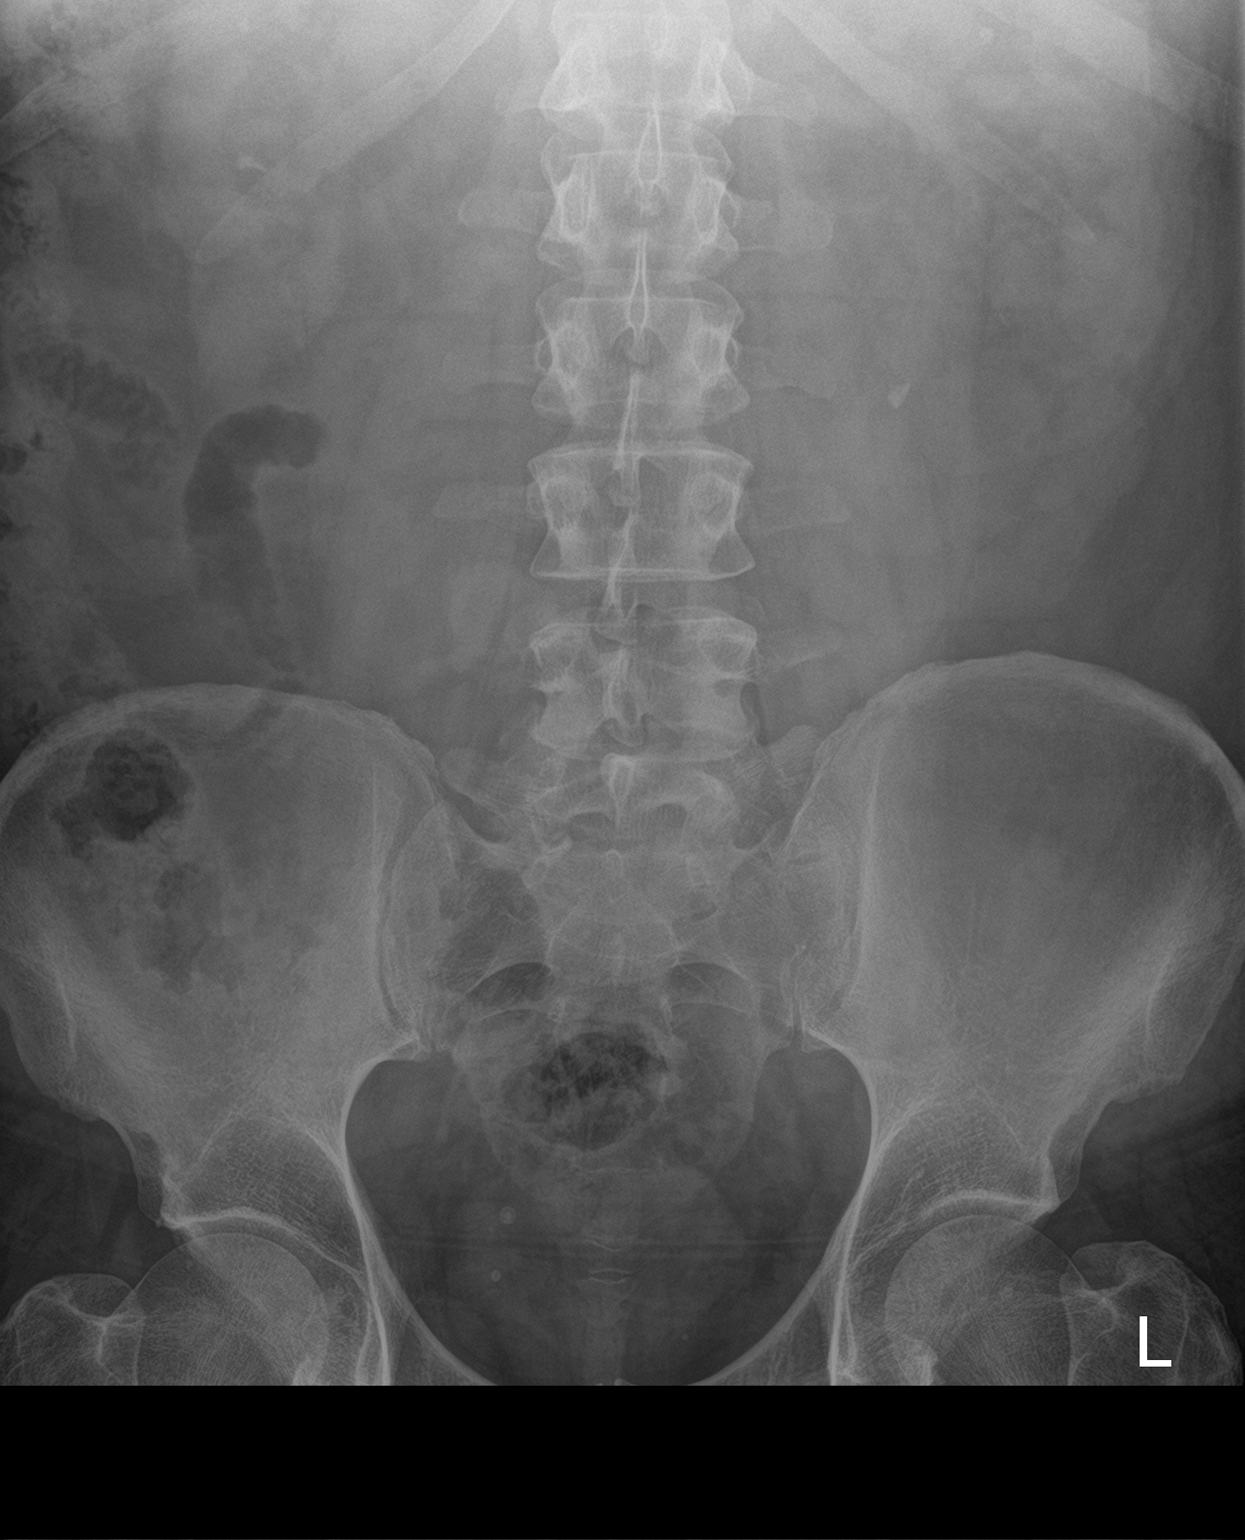

[1 of 1 positions shown; findings below may reference images not displayed]

FINDINGS: Bowel gas pattern is nonobstructive. Evidence patient's known
bilateral nephrolithiasis. Stable known 5-6 mm stone in the region
of the proximal left ureter. Remainder of the exam is unchanged.
IMPRESSION: 1. Nonobstructive bowel gas pattern.
2. Bilateral nephrolithiasis unchanged. Stable known 5-6 mm proximal
left ureteral stone.
